# Patient Record
Sex: Female | Born: 1980
Health system: Southern US, Community
[De-identification: ages and names within clinical notes are randomized; demographics above are authoritative.]

---

## 2016-03-18 LAB — LIPID PANEL
CHOLESTEROL: 211 — AB (ref 0–200)
HDL: 56 (ref 35–70)
LDL CALC: 143

## 2016-03-18 LAB — HEPATIC FUNCTION PANEL
ALK PHOS: 126 — AB (ref 25–125)
ALT: 14 (ref 7–35)
AST: 15 (ref 13–35)
Bilirubin, Total: 0.3

## 2016-03-18 LAB — CBC AND DIFFERENTIAL
HEMATOCRIT: 42 (ref 36–46)
Hemoglobin: 13.8 (ref 12.0–16.0)
Platelets: 331 (ref 150–399)

## 2016-03-18 LAB — VITAMIN D 25 HYDROXY (VIT D DEFICIENCY, FRACTURES): Vit D, 25-Hydroxy: 58

## 2016-03-18 LAB — TSH: TSH: 1.65 (ref 0.41–5.90)

## 2016-03-18 LAB — VITAMIN B12: Vitamin B-12: 1049

## 2016-03-18 LAB — HM PAP SMEAR: HM Pap smear: NEGATIVE

## 2016-03-18 LAB — BASIC METABOLIC PANEL: CREATININE: 0.9 (ref 0.5–1.1)

## 2018-01-01 ENCOUNTER — Other Ambulatory Visit: Payer: Self-pay

## 2018-01-01 ENCOUNTER — Encounter: Payer: Self-pay | Admitting: *Deleted

## 2018-01-01 ENCOUNTER — Emergency Department
Admission: EM | Admit: 2018-01-01 | Discharge: 2018-01-01 | Disposition: A | Discharge status: Home / Self Care | Payer: Federal, State, Local not specified - PPO | Source: Home / Self Care | Attending: Family Medicine | Admitting: Family Medicine

## 2018-01-01 ENCOUNTER — Emergency Department (INDEPENDENT_AMBULATORY_CARE_PROVIDER_SITE_OTHER): Payer: Federal, State, Local not specified - PPO

## 2018-01-01 DIAGNOSIS — M940 Chondrocostal junction syndrome [Tietze]: Secondary | ICD-10-CM | POA: Diagnosis not present

## 2018-01-01 DIAGNOSIS — R0789 Other chest pain: Secondary | ICD-10-CM

## 2018-01-01 DIAGNOSIS — R079 Chest pain, unspecified: Secondary | ICD-10-CM | POA: Diagnosis not present

## 2018-01-01 NOTE — ED Triage Notes (Signed)
Pt c/o intermittent epigastric pain x 2 wks; she now c/o a fullness in the center of her chest. Denies fever or cough. 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

## 2018-01-01 NOTE — Discharge Instructions (Addendum)
Apply ice pack for 20 to 30 minutes, 3 to 4 times daily  Continue until pain decreases.  May take Ibuprofen 200mg, 4 tabs every 8 hours with food.  

## 2018-01-01 NOTE — ED Provider Notes (Signed)
[facility]    CSN: [id number] Arrival date & time: 01/01/18  1037     History   Chief Complaint Chief Complaint  Patient presents with  . Abdominal Pain    HPI Kirsten Cooper is a 37 y.o. female.   Patient reports that she had mild URI symptoms 2 weeks ago with sore throat and cough.  Several days later she developed soreness in her lower mid anterior chest, worse with inspiration and movement.  She denies shortness of breath or wheezing.  No nausea/vomiting. The pain awakens her at night and improves when she sits up.  No fevers, chills, and sweats.  Her respiratory symptoms have resolved.  The history is provided by the patient.  Abdominal Pain  Pain location:  Epigastric Pain quality: fullness   Pain radiates to:  Does not radiate Pain severity:  Mild Onset quality:  Gradual Duration:  2 weeks Timing:  Constant Progression:  Unchanged Chronicity:  New Context: awakening from sleep and recent illness   Context: not diet changes, not eating, not recent travel, not sick contacts and not trauma   Relieved by:  Nothing Worsened by:  Movement, deep breathing, palpation and position changes Ineffective treatments:  None tried Associated symptoms: chest pain, cough and sore throat   Associated symptoms: no anorexia, no belching, no chills, no diarrhea, no dysuria, no fatigue, no fever, no hematemesis, no hematochezia, no hematuria, no nausea, no shortness of breath and no vomiting     History reviewed. No pertinent past medical history.  There are no active problems to display for this patient.   Past Surgical History:  Procedure Laterality Date  . CESAREAN SECTION      OB History   None      Home Medications    Prior to Admission medications   Not on File    Family History History reviewed. No pertinent family history.  Social History Social History   Tobacco Use  . Smoking status: Never Smoker  . Smokeless tobacco: Never Used    Substance Use Topics  . Alcohol use: Never    Frequency: Never  . Drug use: Never     Allergies   Patient has no known allergies.   Review of Systems Review of Systems  Constitutional: Negative for chills, fatigue and fever.  HENT: Positive for sore throat.   Eyes: Negative.   Respiratory: Positive for cough and chest tightness. Negative for shortness of breath and wheezing.   Cardiovascular: Positive for chest pain. Negative for leg swelling.  Gastrointestinal: Positive for abdominal pain. Negative for anorexia, diarrhea, hematemesis, hematochezia, nausea and vomiting.  Genitourinary: Negative for dysuria and hematuria.  Musculoskeletal: Negative.   Skin: Negative.      Physical Exam Triage Vital Signs ED Triage Vitals  Enc Vitals Group     BP      Pulse      Resp      Temp      Temp src      SpO2      Weight      Height      Head Circumference      Peak Flow      Pain Score      Pain Loc      Pain Edu?      Excl. in GC?    No data found.  Updated Vital Signs BP 121/77 (BP Location: Right Arm)   Pulse 75   Temp 98.4 F (36.9 C) (Oral)  Resp 16   Ht 5' 2.5" (1.588 m)   Wt 70.8 kg   LMP 12/26/2017   SpO2 100%   BMI 28.08 kg/m   Visual Acuity Right Eye Distance:   Left Eye Distance:   Bilateral Distance:    Right Eye Near:   Left Eye Near:    Bilateral Near:     Physical Exam  Constitutional: She appears well-developed and well-nourished. She does not appear ill. No distress.  HENT:  Head: Normocephalic.  Right Ear: External ear normal.  Left Ear: External ear normal.  Nose: Nose normal.  Mouth/Throat: Oropharynx is clear and moist.  Eyes: Pupils are equal, round, and reactive to light. Conjunctivae are normal.  Neck: Neck supple.  Cardiovascular: Normal heart sounds.  Pulmonary/Chest: Breath sounds normal. She exhibits bony tenderness. She exhibits no crepitus, no edema and no swelling.  There is distinct tenderness to palpation over  the xiphoid process; palpation there recreates her pain.    Abdominal: Soft. Bowel sounds are normal. She exhibits no distension and no mass. There is no tenderness. There is no guarding.  Musculoskeletal: She exhibits no edema or tenderness.  Lymphadenopathy:    She has no cervical adenopathy.  Neurological: She is alert.  Skin: Skin is warm and dry. No rash noted.  Nursing note and vitals reviewed.    UC Treatments / Results  Labs (all labs ordered are listed, but only abnormal results are displayed) Labs Reviewed - No data to display  EKG None  Radiology Dg Chest 2 View  Result Date: 01/01/2018 CLINICAL DATA:  Two weeks of anterior chest pain EXAM: CHEST - 2 VIEW COMPARISON:  None. FINDINGS: The heart size and mediastinal contours are within normal limits. Both lungs are clear. The visualized skeletal structures are unremarkable. IMPRESSION: No active cardiopulmonary disease. Electronically Signed   By: Alcide Clever M.D.   On: 01/01/2018 11:37    Procedures Procedures (including critical care time)  Medications Ordered in UC Medications - No data to display  Initial Impression / Assessment and Plan / UC Course  I have reviewed the triage vital signs and the nursing notes.  Pertinent labs & imaging results that were available during my care of the patient were reviewed by me and considered in my medical decision making (see chart for details).    Reassurance. Followup with Dr. Rodney Langton or Dr. Clementeen [facility] ([facility]) if not improving about two weeks.    Final Clinical Impressions(s) / UC Diagnoses   Final diagnoses:  Costochondritis     Discharge Instructions     Apply ice pack for 20 to 30 minutes, 3 to 4 times daily  Continue until pain decreases.  May take Ibuprofen 200mg , 4 tabs every 8 hours with food.     ED Prescriptions    None         Lattie Haw, MD 01/01/18 1157

## 2018-01-18 ENCOUNTER — Encounter: Payer: Self-pay | Admitting: Family Medicine

## 2018-01-18 ENCOUNTER — Emergency Department (INDEPENDENT_AMBULATORY_CARE_PROVIDER_SITE_OTHER)
Admission: EM | Admit: 2018-01-18 | Discharge: 2018-01-18 | Disposition: A | Discharge status: Home / Self Care | Payer: Federal, State, Local not specified - PPO | Source: Home / Self Care

## 2018-01-18 ENCOUNTER — Other Ambulatory Visit: Payer: Self-pay

## 2018-01-18 DIAGNOSIS — A071 Giardiasis [lambliasis]: Secondary | ICD-10-CM | POA: Diagnosis not present

## 2018-01-18 MED ORDER — METRONIDAZOLE 500 MG PO TABS: 500.0000 mg | ORAL_TABLET | Freq: Two times a day (BID) | ORAL | 0 refills | Status: DC

## 2018-01-18 MED ORDER — ALUM & MAG HYDROXIDE-SIMETH 200-200-20 MG/5ML PO SUSP: 30.0000 mL | Freq: Once | ORAL | Status: DC

## 2018-01-18 MED ORDER — LIDOCAINE VISCOUS HCL 2 % MT SOLN: 15.0000 mL | Freq: Once | OROMUCOSAL | Status: DC

## 2018-01-18 MED ORDER — ALUM & MAG HYDROXIDE-SIMETH 200-200-20 MG/5ML PO SUSP
30.0000 mL | Freq: Once | ORAL | Status: AC
  Administered 2018-01-18: 30 mL via ORAL

## 2018-01-18 MED ORDER — LIDOCAINE VISCOUS HCL 2 % MT SOLN
15.0000 mL | Freq: Once | OROMUCOSAL | Status: AC
  Administered 2018-01-18: 15 mL via OROMUCOSAL

## 2018-01-18 NOTE — ED Triage Notes (Signed)
Pt was seen here a couple of weeks ago, chest pain has resolved, but still having epigastric pain, 7 out of 10 now and 10 in the evenings.  No change in pain level with what she is eating.

## 2018-01-18 NOTE — Discharge Instructions (Addendum)
Your symptoms and exam are most consistent with Giardia.  There is a possibility that you have H. pylori, but I think this is less likely.  If you are not getting better over the next 3 to 4 days, please return for further evaluation.

## 2018-01-18 NOTE — ED Provider Notes (Signed)
[facility]    CSN: [id number] Arrival date & time: 01/18/18  1433     History   Chief Complaint Chief Complaint  Patient presents with  . Abdominal Pain    HPI Kirsten Cooper is a 37 y.o. female.   This 37 year old woman presents with abdominal pain.  She was recently seen (01/01/2018) for costochondritis with pain in her epigastrium/xiphoid area.  She was prescribed ibuprofen 800 mg 3 times daily.  At the time she said that she had 2 different kinds of pain, one the rib pain and the other was a deeper pain.  She went to the doctor to focus on her rib pain at the time.  Patient can recall the first day this started.  Its been a month now.  Patient's been having bloating, belching, substernal and epigastric pressure and burning.  She says the belching is foul tasting.  She is also had a change in her bowel pattern so that she is no longer regular.  Patient denies vomiting, fever, or diarrhea.  There is been no blood in the stool.  Nobody else is sick at home.  There are no accompanying urinary symptoms.  Patient has taken Tums, magnesium citrate without any relief.  She states that she has recently moved from [place].     History reviewed. No pertinent past medical history.  There are no active problems to display for this patient.   Past Surgical History:  Procedure Laterality Date  . CESAREAN SECTION      OB History   None      Home Medications    Prior to Admission medications   Medication Sig Start Date End Date Taking? Authorizing Provider  metroNIDAZOLE (FLAGYL) 500 MG tablet Take 1 tablet (500 mg total) by mouth 2 (two) times daily. 01/18/18   Elvina Sidle, MD    Family History History reviewed. No pertinent family history.  Social History Social History   Tobacco Use  . Smoking status: Never Smoker  . Smokeless tobacco: Never Used  Substance Use Topics  . Alcohol use: Never    Frequency: Never  . Drug use: Never      Allergies   Patient has no known allergies.   Review of Systems Review of Systems  Gastrointestinal: Positive for abdominal distention, abdominal pain and constipation.  All other systems reviewed and are negative.    Physical Exam Triage Vital Signs ED Triage Vitals  Enc Vitals Group     BP      Pulse      Resp      Temp      Temp src      SpO2      Weight      Height      Head Circumference      Peak Flow      Pain Score      Pain Loc      Pain Edu?      Excl. in GC?    No data found.  Updated Vital Signs BP 120/83 (BP Location: Right Arm)   Pulse 74   Temp 98 F (36.7 C) (Oral)   Ht 5' 2.5" (1.588 m)   Wt 72.1 kg   LMP 12/26/2017   SpO2 100%   BMI 28.62 kg/m    Physical Exam  Constitutional: She is oriented to person, place, and time. She appears well-developed and well-nourished.  HENT:  Head: Normocephalic and atraumatic.  Mouth/Throat: Oropharynx is clear and moist.  Eyes:  EOM are normal.  Cardiovascular: Normal rate.  Pulmonary/Chest: Effort normal and breath sounds normal.  Abdominal: Soft. Normal appearance and bowel sounds are normal. There is no splenomegaly or hepatomegaly. There is tenderness in the epigastric area. There is no rigidity.  Neurological: She is alert and oriented to person, place, and time.  Skin: Skin is warm.  Psychiatric: She has a normal mood and affect. Her behavior is normal.  Nursing note and vitals reviewed.  Relief after the GI cocktail.  UC Treatments / Results  Labs (all labs ordered are listed, but only abnormal results are displayed) Labs Reviewed - No data to display  EKG None  Radiology No results found.  Procedures Procedures (including critical care time)  Medications Ordered in UC Medications  alum & mag hydroxide-simeth (MAALOX/MYLANTA) 200-200-20 MG/5ML suspension 30 mL (30 mLs Oral Given 01/18/18 1507)  lidocaine (XYLOCAINE) 2 % viscous mouth solution 15 mL (15 mLs Mouth/Throat Given  01/18/18 1511)    Initial Impression / Assessment and Plan / UC Course  I have reviewed the triage vital signs and the nursing notes.  Pertinent labs & imaging results that were available during my care of the patient were reviewed by me and considered in my medical decision making (see chart for details).     Final Clinical Impressions(s) / UC Diagnoses   Final diagnoses:  Giardia     Discharge Instructions     Your symptoms and exam are most consistent with Giardia.  There is a possibility that you have H. pylori, but I think this is less likely.  If you are not getting better over the next 3 to 4 days, please return for further evaluation.    ED Prescriptions    Medication Sig Dispense Auth. Provider   metroNIDAZOLE (FLAGYL) 500 MG tablet Take 1 tablet (500 mg total) by mouth 2 (two) times daily. 14 tablet Elvina Sidle, MD     Controlled Substance Prescriptions [place] Controlled Substance Registry consulted? Not Applicable   Elvina Sidle, MD 01/18/18 [id number]

## 2018-01-25 ENCOUNTER — Encounter: Payer: Self-pay | Admitting: Osteopathic Medicine

## 2018-01-25 ENCOUNTER — Ambulatory Visit: Payer: Federal, State, Local not specified - PPO | Admitting: Osteopathic Medicine

## 2018-01-25 ENCOUNTER — Ambulatory Visit (INDEPENDENT_AMBULATORY_CARE_PROVIDER_SITE_OTHER): Payer: Federal, State, Local not specified - PPO | Admitting: Osteopathic Medicine

## 2018-01-25 VITALS — BP 120/87 | HR 70 | Temp 98.2°F | Ht 62.5 in | Wt 160.9 lb

## 2018-01-25 DIAGNOSIS — R4189 Other symptoms and signs involving cognitive functions and awareness: Secondary | ICD-10-CM

## 2018-01-25 DIAGNOSIS — R413 Other amnesia: Secondary | ICD-10-CM

## 2018-01-25 NOTE — Patient Instructions (Signed)
Given memory issues/cognitive impairment, will go ahead and get lab work fasting at your earliest convenience, and if everything is normal we may want to consider formalized testing with a memory specialist.

## 2018-01-25 NOTE — Progress Notes (Signed)
HPI: Kirsten Cooper is a 37 y.o. female who  has no past medical history on file.  she presents to [facility] today, 01/25/18,  for chief complaint of: New to establish Memory concerns   Recently seen in urgent care, twice over the past couple of months.  Once for costochondritis, once for Giardia.  She states she is feeling a lot better now.  Main concern today is memory issues/concerns with cognitive function.  She states that over the past couple of years, she has noticed issues with short-term memory, her family has noticed that she seems to be forgetting things like movies that she has seen recently, she will be driving somewhere and forget where she was going, she frequently feels foggy.  She saw a physician for this a few years ago who recommended stress reduction, the patient does not have any history of anxiety/depression.  She is not sure if she got any blood work done.  She remembers getting brain scans which were normal. No history of attention deficit disorder or learning disability.   G4 P2-1-1-2, no history of gestational diabetes or hypertension/preeclampsia.  Status post C-section x2.  Monogamous with husband, declines STD testing.  Ports last Pap 2018, last mammogram 2018, reports last tetanus shot 3 years ago, no family history of colon cancer or breast cancer    Past medical, surgical, social and family history reviewed:  Patient Active Problem List   Diagnosis Date Noted  . Memory loss or impairment 01/25/2018    Past Surgical History:  Procedure Laterality Date  . CESAREAN SECTION      Social History   Tobacco Use  . Smoking status: Never Smoker  . Smokeless tobacco: Never Used  Substance Use Topics  . Alcohol use: Never    Frequency: Never    History reviewed. No pertinent family history.   Current medication list and allergy/intolerance information reviewed:    No current outpatient medications on file.   No  current facility-administered medications for this visit.     No Known Allergies    Review of Systems:  Constitutional:  No  fever, no chills, No recent illness, No unintentional weight changes. No significant fatigue.   HEENT: No  headache, no vision change, no hearing change, No sore throat, No  sinus pressure  Cardiac: No  chest pain, No  pressure, No palpitations, No  Orthopnea  Respiratory:  No  shortness of breath. No  Cough  Gastrointestinal: No  abdominal pain, No  nausea, No  vomiting,  No  blood in stool, No  diarrhea, No  constipation   Musculoskeletal: No new myalgia/arthralgia   Skin: No  Rash, No other wounds/concerning lesions  Genitourinary: No  incontinence, No  abnormal genital bleeding, No abnormal genital discharge  Hem/Onc: No  easy bruising/bleeding, No  abnormal lymph node  Endocrine: No cold intolerance,  No heat intolerance. No polyuria/polydipsia/polyphagia   Neurologic: No  weakness, No  dizziness, No  slurred speech/focal weakness/facial droop  Psychiatric: No  concerns with depression, No  concerns with anxiety, No sleep problems, No mood problems  Exam:  BP 120/87 (BP Location: Left Arm, Patient Position: Sitting, Cuff Size: Normal)   Pulse 70   Temp 98.2 F (36.8 C) (Oral)   Ht 5' 2.5" (1.588 m)   Wt 160 lb 14.4 oz (73 kg)   LMP 12/26/2017   BMI 28.96 kg/m   Constitutional: VS see above. General Appearance: alert, well-developed, well-nourished, NAD  Eyes: Normal lids and  conjunctive, non-icteric sclera  Ears, Nose, Mouth, Throat: MMM, Normal external inspection ears/nares/mouth/lips/gums. TM normal bilaterally. Pharynx/tonsils no erythema, no exudate. Nasal mucosa normal.   Neck: No masses, trachea midline. No thyroid enlargement. No tenderness/mass appreciated. No lymphadenopathy  Respiratory: Normal respiratory effort. no wheeze, no rhonchi, no rales  Cardiovascular: S1/S2 normal, no murmur, no rub/gallop auscultated. RRR. No  lower extremity edema. Pedal pulse II/IV bilaterally DP and PT.   Gastrointestinal: Nontender, no masses. No hepatomegaly, no splenomegaly. No hernia appreciated. Bowel sounds normal. Rectal exam deferred.   Musculoskeletal: Gait normal. No clubbing/cyanosis of digits.   Neurological: Normal balance/coordination. No tremor. No cranial nerve deficit on limited exam. Motor and sensation intact and symmetric. Cerebellar reflexes intact.   Skin: warm, dry, intact. No rash/ulcer. No concerning nevi or subq nodules on limited exam.    Psychiatric: Normal judgment/insight. Normal mood and affect. Oriented x3.     ASSESSMENT/PLAN: The primary encounter diagnosis was Memory loss or impairment. A diagnosis of Cognitive impairment was also pertinent to this visit.  Orders Placed This Encounter  Procedures  . CBC  . COMPLETE METABOLIC PANEL WITH GFR  . Lipid panel  . TSH  . VITAMIN D 25 Hydroxy (Vit-D Deficiency, Fractures)  . Sedimentation rate  . Vitamin B12  . Folate RBC         Patient Instructions  Given memory issues/cognitive impairment, will go ahead and get lab work fasting at your earliest convenience, and if everything is normal we may want to consider formalized testing with a memory specialist.      Visit summary with medication list and pertinent instructions was printed for patient to review. All questions at time of visit were answered - patient instructed to contact office with any additional concerns. ER/RTC precautions were reviewed with the patient.   Follow-up plan: Return for annual physical sometime in the next year, see me sooner if needed.     Please note: voice recognition software was used to produce this document, and typos may escape review. Please contact Dr. Lyn HollingsheadAlexander for any needed clarifications.

## 2018-02-06 LAB — COMPLETE METABOLIC PANEL WITH GFR
AG RATIO: 1.4 (calc) (ref 1.0–2.5)
ALKALINE PHOSPHATASE (APISO): 111 U/L (ref 33–115)
ALT: 13 U/L (ref 6–29)
AST: 16 U/L (ref 10–30)
Albumin: 3.9 g/dL (ref 3.6–5.1)
BILIRUBIN TOTAL: 0.4 mg/dL (ref 0.2–1.2)
BUN: 10 mg/dL (ref 7–25)
CHLORIDE: 103 mmol/L (ref 98–110)
CO2: 26 mmol/L (ref 20–32)
Calcium: 9.1 mg/dL (ref 8.6–10.2)
Creat: 0.83 mg/dL (ref 0.50–1.10)
GFR, EST NON AFRICAN AMERICAN: 89 mL/min/{1.73_m2} (ref >=60)
GFR, Est African American: 103 mL/min/{1.73_m2} (ref >=60)
GLOBULIN: 2.8 g/dL (ref 1.9–3.7)
Glucose, Bld: 87 mg/dL (ref 65–139)
POTASSIUM: 3.8 mmol/L (ref 3.5–5.3)
SODIUM: 139 mmol/L (ref 135–146)
Total Protein: 6.7 g/dL (ref 6.1–8.1)

## 2018-02-06 LAB — FOLATE RBC: RBC FOLATE: 759 ng/mL (ref >280)

## 2018-02-06 LAB — VITAMIN B12: VITAMIN B 12: 1002 pg/mL (ref 200–1100)

## 2018-02-06 LAB — SEDIMENTATION RATE: Sed Rate: 9 mm/h (ref 0–20)

## 2018-02-06 LAB — CBC
HEMATOCRIT: 39.7 % (ref 35.0–45.0)
Hemoglobin: 13.3 g/dL (ref 11.7–15.5)
MCH: 29.1 pg (ref 27.0–33.0)
MCHC: 33.5 g/dL (ref 32.0–36.0)
MCV: 86.9 fL (ref 80.0–100.0)
MPV: 11 fL (ref 7.5–12.5)
PLATELETS: 326 10*3/uL (ref 140–400)
RBC: 4.57 10*6/uL (ref 3.80–5.10)
RDW: 12.5 % (ref 11.0–15.0)
WBC: 8.6 10*3/uL (ref 3.8–10.8)

## 2018-02-06 LAB — LIPID PANEL
Cholesterol: 202 mg/dL — ABNORMAL HIGH (ref <200)
HDL: 56 mg/dL (ref >50)
LDL Cholesterol (Calc): 129 mg/dL (calc) — ABNORMAL HIGH
NON-HDL CHOLESTEROL (CALC): 146 mg/dL — AB (ref <130)
Total CHOL/HDL Ratio: 3.6 (calc) (ref <5.0)
Triglycerides: 76 mg/dL (ref <150)

## 2018-02-06 LAB — VITAMIN D 25 HYDROXY (VIT D DEFICIENCY, FRACTURES): Vit D, 25-Hydroxy: 27 ng/mL — ABNORMAL LOW (ref 30–100)

## 2018-02-06 LAB — TSH: TSH: 1.53 mIU/L

## 2018-03-21 ENCOUNTER — Encounter: Payer: Self-pay | Admitting: Osteopathic Medicine

## 2018-04-18 ENCOUNTER — Ambulatory Visit (INDEPENDENT_AMBULATORY_CARE_PROVIDER_SITE_OTHER): Payer: Federal, State, Local not specified - PPO | Admitting: Osteopathic Medicine

## 2018-04-18 ENCOUNTER — Encounter: Payer: Self-pay | Admitting: Osteopathic Medicine

## 2018-04-18 VITALS — BP 110/67 | HR 70 | Temp 98.1°F | Wt 162.1 lb

## 2018-04-18 DIAGNOSIS — Z0000 Encounter for general adult medical examination without abnormal findings: Secondary | ICD-10-CM | POA: Diagnosis not present

## 2018-04-18 DIAGNOSIS — G4484 Primary exertional headache: Secondary | ICD-10-CM

## 2018-04-18 HISTORY — DX: Primary exertional headache: G44.84

## 2018-04-18 NOTE — Progress Notes (Signed)
HPI: Kirsten Cooper is a 38 y.o. female who  has no past medical history on file.  she presents to [facility] today, 04/18/18,  for chief complaint of: Annual Check Up    Patient here for annual physical / wellness exam.  See preventive care reviewed as below.  Recent labs 01/2018 reviewed in detail with the patient.   Additional concerns today include: Headaches during and after working out - she is going to the gym more and trying to get healthy as she approaches 38 yo. Headache feels behind eyes "like something is going ot pop" and resolves with rest. She's been trying to "push through" her workouts anyway.      Past medical, surgical, social and family history reviewed:  Patient Active Problem List   Diagnosis Date Noted  . Memory loss or impairment 01/25/2018    Past Surgical History:  Procedure Laterality Date  . CESAREAN SECTION      Social History   Tobacco Use  . Smoking status: Never Smoker  . Smokeless tobacco: Never Used  Substance Use Topics  . Alcohol use: Never    Frequency: Never    No family history on file.   Current medication list and allergy/intolerance information reviewed:    No current outpatient medications on file.   No current facility-administered medications for this visit.     No Known Allergies    Review of Systems:  Constitutional:  No  fever, no chills, No recent illness, No unintentional weight changes. No significant fatigue.   HEENT: +headache, no vision change, no hearing change, No sore throat, No  sinus pressure  Cardiac: No  chest pain, No  pressure, No palpitations, No  Orthopnea  Respiratory:  No  shortness of breath. No  Cough  Gastrointestinal: No  abdominal pain, No  nausea, No  vomiting,  No  blood in stool, No  diarrhea, No  constipation   Musculoskeletal: No new myalgia/arthralgia  Skin: No  Rash, No other wounds/concerning lesions  Genitourinary: No  incontinence,  No  abnormal genital bleeding, No abnormal genital discharge  Hem/Onc: No  easy bruising/bleeding, No  abnormal lymph node  Endocrine: No cold intolerance,  No heat intolerance. No polyuria/polydipsia/polyphagia   Neurologic: No  weakness, No  dizziness, No  slurred speech/focal weakness/facial droop  Psychiatric: No  concerns with depression, No  concerns with anxiety, No sleep problems, No mood problems  Exam:  BP 110/67 (BP Location: Left Arm, Patient Position: Sitting, Cuff Size: Normal)   Pulse 70   Temp 98.1 F (36.7 C) (Oral)   Wt 162 lb 1.6 oz (73.5 kg)   BMI 29.18 kg/m   Constitutional: VS see above. General Appearance: alert, well-developed, well-nourished, NAD  Eyes: Normal lids and conjunctive, non-icteric sclera, EOMI, limited nondilated funduscopic exam but I see no papilledema  Ears, Nose, Mouth, Throat: MMM, Normal external inspection ears/nares/mouth/lips/gums. TM normal bilaterally. Pharynx/tonsils no erythema, no exudate. Nasal mucosa normal.   Neck: No masses, trachea midline. No thyroid enlargement. No tenderness/mass appreciated. No lymphadenopathy  Respiratory: Normal respiratory effort. no wheeze, no rhonchi, no rales  Cardiovascular: S1/S2 normal, no murmur, no rub/gallop auscultated. RRR. No lower extremity edema.   Gastrointestinal: Nontender, no masses. No hepatomegaly, no splenomegaly. No hernia appreciated. Bowel sounds normal. Rectal exam deferred.   Musculoskeletal: Gait normal. No clubbing/cyanosis of digits.   Neurological: Normal balance/coordination. No tremor. No cranial nerve deficit on limited exam. Motor and sensation intact and symmetric. Cerebellar reflexes intact.  Skin: warm, dry, intact. No rash/ulcer. No concerning nevi or subq nodules on limited exam.    Psychiatric: Normal judgment/insight. Normal mood and affect. Oriented x3.        ASSESSMENT/PLAN: The primary encounter diagnosis was Annual physical exam. A diagnosis of  Exertional headache was also pertinent to this visit.   Orders Placed This Encounter  Procedures  . MR Angiogram Head Wo Contrast  . MR Brain Wo Contrast      Patient Instructions  General Preventive Care  Most recent routine screening lipids/other labs: already checked 01/2018  Everyone should have blood pressure checked once per year.   Tobacco: don't!   Exercise: as tolerated to reduce risk of cardiovascular disease and diabetes. Strength training will also prevent osteoporosis.   Mental health: if need for mental health care (medicines, counseling, other), or concerns about moods, please let me know!   Sexual health: if need for STD testing, or if concerns with libido/pain problems, please let me know! If you need to discuss your birth control options, please let me know!   Advanced Directive: Living Will and/or Healthcare Power of Attorney recommended for all adults, regardless of age or health.  Vaccines  Flu vaccine: recommended for almost everyone, every fall.   Shingles vaccine: Shingrix recommended after age 96.   Pneumonia vaccines: Prevnar and Pneumovax recommended after age 39, or sooner if certain medical conditions.  Tetanus booster: Tdap recommended every 10 years.  Cancer screenings   Colon cancer screening: recommended for everyone at age 35  Breast cancer screening: mammogram recommended at age 66 every other year at least, and annually after age 62.   Cervical cancer screening: Pap every 1 to 5 years depending on age and other risk factors. Can usually stop at age 80 or w/ hysterectomy.   Lung cancer screening: not needed for non-smokers Infection screenings . HIV: recommended screening at least once age 26-65, more often as needed. . Gonorrhea/Chlamydia: screening as needed. . Hepatitis C: recommended once for anyone born [birth year]-1965 . TB: certain at-risk populations, or depending on work requirements and/or travel history Other . Bone Density  Test: recommended for women at age 70    Please note: Preventive care issues were addressed today as part of your annual wellness physical, and this care should be covered under your insurance. However there were other medical issues which were also addressed today, and insurance may bill you separately for a "problem-based visit" for this care: headaches. Any questions or concerns about charges which may appear on your billing statement should be directed to your insurance company or to [facility] billing department, and they will contact our office if there are further concerns.          Visit summary with medication list and pertinent instructions was printed for patient to review. All questions at time of visit were answered - patient instructed to contact office with any additional concerns or updates. ER/RTC precautions were reviewed with the patient.     Please note: voice recognition software was used to produce this document, and typos may escape review. Please contact Dr. Lyn Hollingshead for any needed clarifications.     Follow-up plan: Return for recheck depending on MRI results - if any abnormality, will plan to see you in office to discuss .

## 2018-04-18 NOTE — Patient Instructions (Addendum)
General Preventive Care  Most recent routine screening lipids/other labs: already checked 01/2018  Everyone should have blood pressure checked once per year.   Tobacco: don't!   Exercise: as tolerated to reduce risk of cardiovascular disease and diabetes. Strength training will also prevent osteoporosis.   Mental health: if need for mental health care (medicines, counseling, other), or concerns about moods, please let me know!   Sexual health: if need for STD testing, or if concerns with libido/pain problems, please let me know! If you need to discuss your birth control options, please let me know!   Advanced Directive: Living Will and/or Healthcare Power of Attorney recommended for all adults, regardless of age or health.  Vaccines  Flu vaccine: recommended for almost everyone, every fall.   Shingles vaccine: Shingrix recommended after age 26.   Pneumonia vaccines: Prevnar and Pneumovax recommended after age 62, or sooner if certain medical conditions.  Tetanus booster: Tdap recommended every 10 years.  Cancer screenings   Colon cancer screening: recommended for everyone at age 67  Breast cancer screening: mammogram recommended at age 3 every other year at least, and annually after age 40.   Cervical cancer screening: Pap every 1 to 5 years depending on age and other risk factors. Can usually stop at age 65 or w/ hysterectomy.   Lung cancer screening: not needed for non-smokers Infection screenings . HIV: recommended screening at least once age 35-65, more often as needed. . Gonorrhea/Chlamydia: screening as needed. . Hepatitis C: recommended once for anyone born [birth year]-1965 . TB: certain at-risk populations, or depending on work requirements and/or travel history Other . Bone Density Test: recommended for women at age 59    Please note: Preventive care issues were addressed today as part of your annual wellness physical, and this care should be covered under your  insurance. However there were other medical issues which were also addressed today, and insurance may bill you separately for a "problem-based visit" for this care: headaches. Any questions or concerns about charges which may appear on your billing statement should be directed to your insurance company or to [facility] billing department, and they will contact our office if there are further concerns.

## 2018-04-30 ENCOUNTER — Ambulatory Visit (INDEPENDENT_AMBULATORY_CARE_PROVIDER_SITE_OTHER): Payer: Federal, State, Local not specified - PPO

## 2018-04-30 DIAGNOSIS — G4484 Primary exertional headache: Secondary | ICD-10-CM

## 2018-04-30 DIAGNOSIS — R51 Headache: Secondary | ICD-10-CM | POA: Diagnosis not present

## 2018-09-09 ENCOUNTER — Emergency Department (HOSPITAL_BASED_OUTPATIENT_CLINIC_OR_DEPARTMENT_OTHER): Payer: Federal, State, Local not specified - PPO

## 2018-09-09 ENCOUNTER — Encounter (HOSPITAL_BASED_OUTPATIENT_CLINIC_OR_DEPARTMENT_OTHER): Payer: Self-pay | Admitting: *Deleted

## 2018-09-09 ENCOUNTER — Other Ambulatory Visit: Payer: Self-pay

## 2018-09-09 ENCOUNTER — Emergency Department (HOSPITAL_BASED_OUTPATIENT_CLINIC_OR_DEPARTMENT_OTHER)
Admission: EM | Admit: 2018-09-09 | Discharge: 2018-09-09 | Disposition: A | Discharge status: Home / Self Care | Payer: Federal, State, Local not specified - PPO | Attending: Emergency Medicine | Admitting: Emergency Medicine

## 2018-09-09 DIAGNOSIS — R0789 Other chest pain: Secondary | ICD-10-CM

## 2018-09-09 DIAGNOSIS — K2100 Gastro-esophageal reflux disease with esophagitis, without bleeding: Secondary | ICD-10-CM

## 2018-09-09 DIAGNOSIS — R0602 Shortness of breath: Secondary | ICD-10-CM | POA: Diagnosis not present

## 2018-09-09 HISTORY — DX: Gastro-esophageal reflux disease with esophagitis, without bleeding: K21.00

## 2018-09-09 LAB — LIPASE, BLOOD: Lipase: 71 U/L — ABNORMAL HIGH (ref 11–51)

## 2018-09-09 LAB — D-DIMER, QUANTITATIVE: D-Dimer, Quant: 0.43 ug/mL-FEU (ref 0.00–0.50)

## 2018-09-09 LAB — COMPREHENSIVE METABOLIC PANEL
ALT: 11 U/L (ref 0–44)
AST: 23 U/L (ref 15–41)
Albumin: 3.7 g/dL (ref 3.5–5.0)
Alkaline Phosphatase: 124 U/L (ref 38–126)
Anion gap: 8 (ref 5–15)
BUN: 9 mg/dL (ref 6–20)
CO2: 21 mmol/L — ABNORMAL LOW (ref 22–32)
Calcium: 8.4 mg/dL — ABNORMAL LOW (ref 8.9–10.3)
Chloride: 107 mmol/L (ref 98–111)
Creatinine, Ser: 0.76 mg/dL (ref 0.44–1.00)
GFR calc Af Amer: >60 mL/min (ref >60)
GFR calc non Af Amer: >60 mL/min (ref >60)
Glucose, Bld: 93 mg/dL (ref 70–99)
Potassium: 3.9 mmol/L (ref 3.5–5.1)
Sodium: 136 mmol/L (ref 135–145)
Total Bilirubin: 0.6 mg/dL (ref 0.3–1.2)
Total Protein: 6.9 g/dL (ref 6.5–8.1)

## 2018-09-09 LAB — CBC WITH DIFFERENTIAL/PLATELET
Abs Immature Granulocytes: 0.07 10*3/uL (ref 0.00–0.07)
Basophils Absolute: 0.1 10*3/uL (ref 0.0–0.1)
Basophils Relative: 1 %
Eosinophils Absolute: 0.2 10*3/uL (ref 0.0–0.5)
Eosinophils Relative: 2 %
HCT: 43.3 % (ref 36.0–46.0)
Hemoglobin: 13.8 g/dL (ref 12.0–15.0)
Immature Granulocytes: 1 %
Lymphocytes Relative: 26 %
Lymphs Abs: 3 10*3/uL (ref 0.7–4.0)
MCH: 28.9 pg (ref 26.0–34.0)
MCHC: 31.9 g/dL (ref 30.0–36.0)
MCV: 90.8 fL (ref 80.0–100.0)
Monocytes Absolute: 1.1 10*3/uL — ABNORMAL HIGH (ref 0.1–1.0)
Monocytes Relative: 10 %
Neutro Abs: 7.1 10*3/uL (ref 1.7–7.7)
Neutrophils Relative %: 60 %
Platelets: 270 10*3/uL (ref 150–400)
RBC: 4.77 MIL/uL (ref 3.87–5.11)
RDW: 12.6 % (ref 11.5–15.5)
WBC: 11.5 10*3/uL — ABNORMAL HIGH (ref 4.0–10.5)
nRBC: 0 % (ref 0.0–0.2)

## 2018-09-09 LAB — TROPONIN I (HIGH SENSITIVITY): Troponin I (High Sensitivity): <2 ng/L (ref <18)

## 2018-09-09 MED ORDER — ALUM & MAG HYDROXIDE-SIMETH 200-200-20 MG/5ML PO SUSP
30.0000 mL | Freq: Once | ORAL | Status: AC
  Administered 2018-09-09: 30 mL via ORAL
  Filled 2018-09-09: qty 30

## 2018-09-09 MED ORDER — LIDOCAINE VISCOUS HCL 2 % MT SOLN
15.0000 mL | Freq: Once | OROMUCOSAL | Status: AC
  Administered 2018-09-09: 22:00:00 15 mL via ORAL
  Filled 2018-09-09: qty 15

## 2018-09-09 NOTE — ED Notes (Signed)
EKG will be attempted once patient comes back from Xray

## 2018-09-09 NOTE — ED Notes (Signed)
Patient transported to X-ray 

## 2018-09-09 NOTE — Discharge Instructions (Signed)
All your testing today for blood clot and heart problems were normal.  Suspect this is related to your stomach.  Get some over-the-counter Prilosec to take for a few weeks and try to avoid ibuprofen products, spicy or acidic foods.  Also laying down flat may seem to make your symptoms worse.  You can also try Tums or Maalox as needed for the discomfort.  Sometimes milk will also help.

## 2018-09-09 NOTE — ED Provider Notes (Signed)
[facility] Provider Note   CSN: [id number] Arrival date & time: 09/09/18  2032     History   Chief Complaint Chief Complaint  Patient presents with  . Shortness of Breath    HPI Kirsten Cooper is a 38 y.o. female.     Patient is a 38 year old female with no significant past medical history who is presenting today with sudden onset of chest tightness and pressure and shortness of breath that started approximately 3 hours ago.  Patient states they got back from [facility] yesterday after an 8-hour car drive.  She had a mild headache today and took some ibuprofen which did improve her headache but caused her to have some stomach discomfort.  That had resolved spontaneously but tonight she developed a tight pressure sensation in her lower sternum that makes her have the sensation of not being able to get a deep breath.  She denies any cough, fever, sputum production.  She has no abdominal pain, nausea or vomiting.  She did try to drink some water and eat something and states it felt unusual when she swallowed.  Laying back may make things slightly worse but she is not sure of that.  She denies any unilateral leg pain or swelling.  She does not use drugs or tobacco  The history is provided by the patient.  Shortness of Breath Severity:  Moderate Onset quality:  Sudden Duration:  3 hours Timing:  Constant Progression:  Unchanged Chronicity:  New Context comment:  Started about 3 hours ago at rest Relieved by:  Nothing Worsened by:  Nothing Ineffective treatments: tried to drink water and eat something but didn't help. Associated symptoms: chest pain   Associated symptoms: no cough, no fever, no sputum production, no vomiting and no wheezing   Risk factors: prolonged immobilization   Risk factors: no family hx of DVT, no hx of PE/DVT, no obesity, no oral contraceptive use, no recent surgery and no tobacco use   Risk factors comment:  Recently  went ot [place] and 8 hours in the car there and back   History reviewed. No pertinent past medical history.  Patient Active Problem List   Diagnosis Date Noted  . Exertional headache 04/18/2018  . Memory loss or impairment 01/25/2018    Past Surgical History:  Procedure Laterality Date  . CESAREAN SECTION       OB History   No obstetric history on file.      Home Medications    Prior to Admission medications   Not on File    Family History No family history on file.  Social History Social History   Tobacco Use  . Smoking status: Never Smoker  . Smokeless tobacco: Never Used  Substance Use Topics  . Alcohol use: Never    Frequency: Never  . Drug use: Never     Allergies   Patient has no known allergies.   Review of Systems Review of Systems  Constitutional: Negative for fever.  Respiratory: Positive for shortness of breath. Negative for cough, sputum production and wheezing.   Cardiovascular: Positive for chest pain.  Gastrointestinal: Negative for vomiting.  All other systems reviewed and are negative.    Physical Exam Updated Vital Signs BP 128/83 (BP Location: Right Arm)   Pulse 73   Temp 98.2 F (36.8 C) (Oral)   Resp 16   Ht 5\' 2"  (1.575 m)   Wt 77.1 kg   LMP 08/19/2018 (Approximate)   SpO2 100%  BMI 31.09 kg/m   Physical Exam Vitals signs and nursing note reviewed.  Constitutional:      General: She is not in acute distress.    Appearance: She is well-developed.  HENT:     Head: Normocephalic and atraumatic.  Eyes:     Pupils: Pupils are equal, round, and reactive to light.  Cardiovascular:     Rate and Rhythm: Normal rate and regular rhythm.     Heart sounds: Normal heart sounds. No murmur. No friction rub.  Pulmonary:     Effort: Pulmonary effort is normal. No tachypnea or accessory muscle usage.     Breath sounds: Normal breath sounds. No wheezing or rales.     Comments: Intermittently taking deep breaths Abdominal:      General: Bowel sounds are normal. There is no distension.     Palpations: Abdomen is soft.     Tenderness: There is no abdominal tenderness. There is no guarding or rebound.  Musculoskeletal: Normal range of motion.        General: No tenderness.     Right lower leg: No edema.     Left lower leg: No edema.     Comments: No edema  Skin:    General: Skin is warm and dry.     Findings: No rash.  Neurological:     Mental Status: She is alert and oriented to person, place, and time.     Cranial Nerves: No cranial nerve deficit.  Psychiatric:        Mood and Affect: Mood normal.        Behavior: Behavior normal.        Thought Content: Thought content normal.      ED Treatments / Results  Labs (all labs ordered are listed, but only abnormal results are displayed) Labs Reviewed  CBC WITH DIFFERENTIAL/PLATELET - Abnormal; Notable for the following components:      Result Value   WBC 11.5 (*)    Monocytes Absolute 1.1 (*)    All other components within normal limits  COMPREHENSIVE METABOLIC PANEL - Abnormal; Notable for the following components:   CO2 21 (*)    Calcium 8.4 (*)    All other components within normal limits  LIPASE, BLOOD - Abnormal; Notable for the following components:   Lipase 71 (*)    All other components within normal limits  D-DIMER, QUANTITATIVE (NOT AT [facility])  TROPONIN I (HIGH SENSITIVITY)  TROPONIN I (HIGH SENSITIVITY)    EKG EKG Interpretation  Date/Time:  Sunday September 09 2018 20:46:47 EDT Ventricular Rate:  77 PR Interval:    QRS Duration: 89 QT Interval:  375 QTC Calculation: 425 R Axis:   32 Text Interpretation:  Sinus rhythm Low voltage, precordial leads Abnormal R-wave progression, early transition Baseline wander in lead(s) V3 No previous tracing Confirmed by Gwyneth SproutPlunkett, Shaketha Jeon ([id number]) on 09/09/2018 8:58:10 PM   Radiology Dg Chest 2 View  Result Date: 09/09/2018 CLINICAL DATA:  Shortness of breath EXAM: CHEST - 2 VIEW COMPARISON:   01/01/2018 FINDINGS: The heart size and mediastinal contours are within normal limits. Both lungs are clear. The visualized skeletal structures are unremarkable. IMPRESSION: No active cardiopulmonary disease. Electronically Signed   By: Katherine Mantlehristopher  Green M.D.   On: 09/09/2018 20:56    Procedures Procedures (including critical care time)  Medications Ordered in ED Medications  alum & mag hydroxide-simeth (MAALOX/MYLANTA) 200-200-20 MG/5ML suspension 30 mL (has no administration in time range)    And  lidocaine (XYLOCAINE) 2 % viscous  mouth solution 15 mL (has no administration in time range)     Initial Impression / Assessment and Plan / ED Course  I have reviewed the triage vital signs and the nursing notes.  Pertinent labs & imaging results that were available during my care of the patient were reviewed by me and considered in my medical decision making (see chart for details).        Pt with atypical story for CP that started approximately 3 hours prior to arrival but has not resolved.  In addition to the chest discomfort she also has shortness of breath but denies any nausea, vomiting or diaphoresis.  Heart score of  1 and no risk factors.  Low risk well's but due to recent prolonged immobilization will check d-dimer.  EKG with low voltage but no signs of ST abnormality .  CXR wnl.  Low suspicion for pneumothorax, pneumonia, COVID, dissection.  Lower suspicion for ACS, PE and suspect most likely GI origin as she had some stomach upset after taking ibuprofen.  CBC, CMP, lipase, d-dimer and trop pending.  Patient given GI cocktail   10:42 PM Patient symptoms initially completely resolved with a GI cocktail but when she started coughing and laid down the symptoms started to recur.  CMP without acute findings, CBC with leukocytosis of 11,000 and lipase elevated to 70.  D-dimer and troponin are both negative.  Given patient's pain has been ongoing for 3 hours and her troponin is negative do  not feel that she needs any repeat testing at this time.  Will treat with a PPI and Maalox.  Discussed with patient if symptoms worsen she may need evaluation of her gallbladder but today total bilirubin and LFTs are within normal limits and she does not have significant right upper quadrant pain or any significant abdominal pain on palpation.  Patient is comfortable with this plan and will return if symptoms worsen or will follow-up with her doctor if it is not improving.  Final Clinical Impressions(s) / ED Diagnoses   Final diagnoses:  Gastroesophageal reflux disease with esophagitis  Atypical chest pain    ED Discharge Orders    None       Gwyneth SproutPlunkett, Reya Aurich, MD 09/09/18 [id number]

## 2018-09-09 NOTE — ED Triage Notes (Addendum)
Pt reports Headache earlier today and she took ibuprofen with relief. Approx 3 hours ago she began feeling SOB at rest. Speaking full sentences. No distress apparent. O2 sats 100% on ra. Reports recent travel by car to [place]

## 2018-09-25 ENCOUNTER — Ambulatory Visit (INDEPENDENT_AMBULATORY_CARE_PROVIDER_SITE_OTHER): Payer: Federal, State, Local not specified - PPO | Admitting: Osteopathic Medicine

## 2018-09-25 ENCOUNTER — Encounter: Payer: Self-pay | Admitting: Osteopathic Medicine

## 2018-09-25 ENCOUNTER — Other Ambulatory Visit: Payer: Self-pay

## 2018-09-25 VITALS — BP 108/67 | HR 82 | Temp 98.5°F | Wt 172.6 lb

## 2018-09-25 DIAGNOSIS — R790 Abnormal level of blood mineral: Secondary | ICD-10-CM

## 2018-09-25 DIAGNOSIS — R1013 Epigastric pain: Secondary | ICD-10-CM

## 2018-09-25 DIAGNOSIS — R748 Abnormal levels of other serum enzymes: Secondary | ICD-10-CM

## 2018-09-25 MED ORDER — ONDANSETRON 8 MG PO TBDP: 8.0000 mg | ORAL_TABLET | Freq: Three times a day (TID) | ORAL | 3 refills | Status: DC | PRN

## 2018-09-25 MED ORDER — OMEPRAZOLE 40 MG PO CPDR: 40.0000 mg | DELAYED_RELEASE_CAPSULE | Freq: Every day | ORAL | 0 refills | Status: DC

## 2018-09-25 NOTE — Patient Instructions (Addendum)
Plan:  Will trial antacid medications and antinausea medications for 8-12 weeks. I think this is mostly acid reflux / gastritis irritation. If not better, or if worse/change, will send to GI specialist to probably look with a camera into the stomach.   Will recheck labs. Calcium as a little low, Lipase (pancreas enzyme) was a little high and Surette blood cells (infectoin, inflammation, stress) were also slightly high.   For feet, I think we can send to podiatrist to discuss custom orthotics for likely muscle/tendon pain. Can take Tylenol up to maximum 1000 mg 3-4 times per day. Compression socks might also help.

## 2018-09-25 NOTE — Progress Notes (Signed)
HPI: Kirsten Cooper is a 38 y.o. female who  has no past medical history on file.  she presents to [facility] today, 09/25/18,  for chief complaint of: Abdominal pain, chest tightness Foot pain  ER follow-up for episode of chest tightness. This was 09/09/18 (about 2 weeks ago) and a GI cocktail in ER improved symptoms, lying supine caused recurrence of symptoms. Labs including D-Dimer and tropes were negative. Recommended PPI and Maalox. As of today pt is not taking any medications    Foot pain bilaterally at soles of feet, hurts to walk.       Past medical, surgical, social and family history reviewed:  Patient Active Problem List   Diagnosis Date Noted  . Exertional headache 04/18/2018  . Memory loss or impairment 01/25/2018    Past Surgical History:  Procedure Laterality Date  . CESAREAN SECTION      Social History   Tobacco Use  . Smoking status: Never Smoker  . Smokeless tobacco: Never Used  Substance Use Topics  . Alcohol use: Never    Frequency: Never    No family history on file.   Current medication list and allergy/intolerance information reviewed:    No current outpatient medications on file.   No current facility-administered medications for this visit.     No Known Allergies    Review of Systems:  Constitutional:  No  fever, no chills, +recent illness per HPI, No unintentional weight changes. No significant fatigue.   HEENT: No  headache, no vision change  Cardiac: No  chest pain, No  pressure, No palpitations, No  Orthopnea  Respiratory:  No  shortness of breath. No  Cough  Gastrointestinal: No  abdominal pain, No  nausea, No  vomiting,  No  blood in stool, No  diarrhea, No  constipation   Musculoskeletal: +new myalgia/arthralgia - foot pain as per HPI   Skin: No  Rash  Neurologic: No  weakness, No  dizziness,  Psychiatric: No  concerns with depression, No  concerns with anxiety, No sleep problems,  No mood problems  Exam:  BP 108/67 (BP Location: Left Arm, Patient Position: Sitting, Cuff Size: Normal)   Pulse 82   Temp 98.5 F (36.9 C) (Oral)   Wt 172 lb 9.6 oz (78.3 kg)   BMI 31.57 kg/m   Constitutional: VS see above. General Appearance: alert, well-developed, well-nourished, NAD  Eyes: Normal lids and conjunctive, non-icteric sclera  Neck: No masses, trachea midline. No thyroid enlargement.  Respiratory: Normal respiratory effort. no wheeze, no rhonchi, no rales  Cardiovascular: S1/S2 normal, no murmur, no rub/gallop auscultated. RRR. No lower extremity edema.   Gastrointestinal: +epigsatric tenderness, no masses. No hepatomegaly, no splenomegaly. No hernia appreciated. Bowel sounds normal. Rectal exam deferred.   Musculoskeletal: Gait normal. No clubbing/cyanosis of digits.   Neurological: Normal balance/coordination. No tremor.  Psychiatric: Normal judgment/insight. Normal mood and affect. Oriented x3.     Recent Results (from the past 2160 hour(s))  CBC with Differential/Platelet     Status: Abnormal   Collection Time: 09/09/18  9:13 PM  Result Value Ref Range   WBC 11.5 (H) 4.0 - 10.5 K/uL   RBC 4.77 3.87 - 5.11 MIL/uL   Hemoglobin 13.8 12.0 - 15.0 g/dL   HCT 16.143.3 09.636.0 - 04.546.0 %   MCV 90.8 80.0 - 100.0 fL   MCH 28.9 26.0 - 34.0 pg   MCHC 31.9 30.0 - 36.0 g/dL   RDW 40.912.6 81.111.5 - 91.415.5 %  Platelets 270 150 - 400 K/uL   nRBC 0.0 0.0 - 0.2 %   Neutrophils Relative % 60 %   Neutro Abs 7.1 1.7 - 7.7 K/uL   Lymphocytes Relative 26 %   Lymphs Abs 3.0 0.7 - 4.0 K/uL   Monocytes Relative 10 %   Monocytes Absolute 1.1 (H) 0.1 - 1.0 K/uL   Eosinophils Relative 2 %   Eosinophils Absolute 0.2 0.0 - 0.5 K/uL   Basophils Relative 1 %   Basophils Absolute 0.1 0.0 - 0.1 K/uL   Immature Granulocytes 1 %   Abs Immature Granulocytes 0.07 0.00 - 0.07 K/uL    Comment: Performed at [facility] [place], [address] [place], [place] [address]  Comprehensive metabolic  panel     Status: Abnormal   Collection Time: 09/09/18  9:13 PM  Result Value Ref Range   Sodium 136 135 - 145 mmol/L   Potassium 3.9 3.5 - 5.1 mmol/L   Chloride 107 98 - 111 mmol/L   CO2 21 (L) 22 - 32 mmol/L   Glucose, Bld 93 70 - 99 mg/dL   BUN 9 6 - 20 mg/dL   Creatinine, Ser 8.460.76 0.44 - 1.00 mg/dL   Calcium 8.4 (L) 8.9 - 10.3 mg/dL   Total Protein 6.9 6.5 - 8.1 g/dL   Albumin 3.7 3.5 - 5.0 g/dL   AST 23 15 - 41 U/L   ALT 11 0 - 44 U/L   Alkaline Phosphatase 124 38 - 126 U/L   Total Bilirubin 0.6 0.3 - 1.2 mg/dL   GFR calc non Af Amer >60 >60 mL/min   GFR calc Af Amer >60 >60 mL/min   Anion gap 8 5 - 15    Comment: Performed at [facility] [place], [address] [place], [place] [address]  Lipase, blood     Status: Abnormal   Collection Time: 09/09/18  9:13 PM  Result Value Ref Range   Lipase 71 (H) 11 - 51 U/L    Comment: Performed at [facility] [place], [address] [place], [place] [address]  D-dimer, quantitative (not at [facility])     Status: None   Collection Time: 09/09/18  9:13 PM  Result Value Ref Range   D-Dimer, Quant 0.43 0.00 - 0.50 ug/mL-FEU    Comment: (NOTE) At the manufacturer cut-off of 0.50 ug/mL FEU, this assay has been documented to exclude PE with a sensitivity and negative predictive value of 97 to 99%.  At this time, this assay has not been approved by the FDA to exclude DVT/VTE. Results should be correlated with clinical presentation. Performed at [facility] [place], [address] [place], [place] [address]   Troponin I (High Sensitivity)     Status: None   Collection Time: 09/09/18  9:13 PM  Result Value Ref Range   Troponin I (High Sensitivity) <2 <18 ng/L    Comment: (NOTE) Elevated high sensitivity troponin I (hsTnI) values and significant  changes across serial measurements may suggest ACS but many other  chronic and acute conditions are known to elevate hsTnI results.  Refer to the Links section for chest pain  algorithms and additional  guidance. Performed at [facility] [place], [address] [place], [place] [address]   Lipase     Status: Abnormal   Collection Time: 09/25/18  3:59 PM  Result Value Ref Range   Lipase 92 (H) 7 - 60 U/L  COMPLETE METABOLIC PANEL WITH  GFR     Status: None   Collection Time: 09/25/18  3:59 PM  Result Value Ref Range   Glucose, Bld 72 65 - 99 mg/dL    Comment: .            Fasting reference interval .    BUN 12 7 - 25 mg/dL   Creat 1.611.01 0.960.50 - 0.451.10 mg/dL   GFR, Est Non African American 70 > OR = 60 mL/min/1.2573m2   GFR, Est African American 81 > OR = 60 mL/min/1.273m2   BUN/Creatinine Ratio NOT APPLICABLE 6 - 22 (calc)   Sodium 137 135 - 146 mmol/L   Potassium 4.3 3.5 - 5.3 mmol/L   Chloride 104 98 - 110 mmol/L   CO2 23 20 - 32 mmol/L   Calcium 9.2 8.6 - 10.2 mg/dL   Total Protein 6.7 6.1 - 8.1 g/dL   Albumin 4.0 3.6 - 5.1 g/dL   Globulin 2.7 1.9 - 3.7 g/dL (calc)   AG Ratio 1.5 1.0 - 2.5 (calc)   Total Bilirubin 0.4 0.2 - 1.2 mg/dL   Alkaline phosphatase (APISO) 100 31 - 125 U/L   AST 20 10 - 30 U/L   ALT 13 6 - 29 U/L       ASSESSMENT/PLAN: The primary encounter diagnosis was Epigastric pain. Diagnoses of Elevated lipase and Decreased blood calcium were also pertinent to this visit.   Orders Placed This Encounter  Procedures  . CT Abdomen Pelvis W Contrast  . Lipase  . COMPLETE METABOLIC PANEL WITH GFR  . Lipase    Meds ordered this encounter  Medications  . omeprazole (PRILOSEC) 40 MG capsule    Sig: Take 1 capsule (40 mg total) by mouth daily. For 8-12 weeks    Dispense:  90 capsule    Refill:  0  . ondansetron (ZOFRAN-ODT) 8 MG disintegrating tablet    Sig: Take 1 tablet (8 mg total) by mouth every 8 (eight) hours as needed for nausea.    Dispense:  20 tablet    Refill:  3    Patient Instructions  Plan:  Will trial antacid medications and antinausea medications for 8-12 weeks. I think this is mostly acid reflux /  gastritis irritation. If not better, or if worse/change, will send to GI specialist to probably look with a camera into the stomach.   Will recheck labs. Calcium as a little low, Lipase (pancreas enzyme) was a little high and Idler blood cells (infectoin, inflammation, stress) were also slightly high.   For feet, I think we can send to podiatrist to discuss custom orthotics for likely muscle/tendon pain. Can take Tylenol up to maximum 1000 mg 3-4 times per day. Compression socks might also help.       From Result note 09/26/18:   MyChart message sent:   Labs look better except for the lipase/pancreatic enzyme. It is actually a bit increased from a couple weeks ago but the elevation is mild - we typically don't diagnose pancreatitis until the levels are in the 180's, your are in the 90's. Inflammation of pancreas could explain the nausea issues.   At this point, we can get a CT of the abdomen to look at the pancreas, and also we can recheck the blood levels in a week. I put in the orders for the CT and for the blood work.   Please maintain a bland diet and drink plenty of water. Most pancreatitis resolves, will know more after the CT. If any questions, let  me know!    Visit summary with medication list and pertinent instructions was printed for patient to review. All questions at time of visit were answered - patient instructed to contact office with any additional concerns or updates. ER/RTC precautions were reviewed with the patient.   Note: Total time spent 40 minutes, greater than 50% of the visit was spent face-to-face counseling and coordinating care for the above diagnoses listed in assessment/plan.   Please note: voice recognition software was used to produce this document, and typos may escape review. Please contact Dr. Sheppard Coil for any needed clarifications.     Follow-up plan: Return if symptoms worsen or fail to improve.

## 2018-09-26 LAB — COMPLETE METABOLIC PANEL WITH GFR
AG Ratio: 1.5 (calc) (ref 1.0–2.5)
ALT: 13 U/L (ref 6–29)
AST: 20 U/L (ref 10–30)
Albumin: 4 g/dL (ref 3.6–5.1)
Alkaline phosphatase (APISO): 100 U/L (ref 31–125)
BUN: 12 mg/dL (ref 7–25)
CO2: 23 mmol/L (ref 20–32)
Calcium: 9.2 mg/dL (ref 8.6–10.2)
Chloride: 104 mmol/L (ref 98–110)
Creat: 1.01 mg/dL (ref 0.50–1.10)
GFR, Est African American: 81 mL/min/{1.73_m2} (ref >=60)
GFR, Est Non African American: 70 mL/min/{1.73_m2} (ref >=60)
Globulin: 2.7 g/dL (calc) (ref 1.9–3.7)
Glucose, Bld: 72 mg/dL (ref 65–99)
Potassium: 4.3 mmol/L (ref 3.5–5.3)
Sodium: 137 mmol/L (ref 135–146)
Total Bilirubin: 0.4 mg/dL (ref 0.2–1.2)
Total Protein: 6.7 g/dL (ref 6.1–8.1)

## 2018-09-26 LAB — LIPASE: Lipase: 92 U/L — ABNORMAL HIGH (ref 7–60)

## 2018-09-27 ENCOUNTER — Other Ambulatory Visit: Payer: Self-pay

## 2018-09-27 ENCOUNTER — Ambulatory Visit (INDEPENDENT_AMBULATORY_CARE_PROVIDER_SITE_OTHER): Payer: Federal, State, Local not specified - PPO

## 2018-09-27 DIAGNOSIS — N3289 Other specified disorders of bladder: Secondary | ICD-10-CM | POA: Diagnosis not present

## 2018-09-27 DIAGNOSIS — R748 Abnormal levels of other serum enzymes: Secondary | ICD-10-CM | POA: Diagnosis not present

## 2018-09-27 DIAGNOSIS — R195 Other fecal abnormalities: Secondary | ICD-10-CM | POA: Diagnosis not present

## 2018-09-27 DIAGNOSIS — N281 Cyst of kidney, acquired: Secondary | ICD-10-CM | POA: Diagnosis not present

## 2018-09-27 DIAGNOSIS — R1013 Epigastric pain: Secondary | ICD-10-CM | POA: Diagnosis not present

## 2018-09-27 MED ORDER — IOHEXOL 300 MG/ML  SOLN
100.0000 mL | Freq: Once | INTRAMUSCULAR | Status: AC | PRN
  Administered 2018-09-27: 100 mL via INTRAVENOUS

## 2018-10-04 LAB — LIPASE: Lipase: 76 U/L — ABNORMAL HIGH (ref 7–60)

## 2018-10-04 NOTE — Addendum Note (Signed)
Addended by: Maryla Morrow on: 10/04/2018 07:21 AM   Modules accepted: Orders

## 2019-02-05 ENCOUNTER — Telehealth: Payer: Self-pay | Admitting: Osteopathic Medicine

## 2019-02-05 NOTE — Telephone Encounter (Signed)
Patient advised of this information 

## 2019-02-05 NOTE — Telephone Encounter (Signed)
Upcoming is not a physical, it is an office visit to discuss specific issues. Patient must be seen and evaluated prior to ordering labs

## 2019-02-05 NOTE — Telephone Encounter (Signed)
Patient was wanting Labs prior to visit with PCP on 02/13/19, states she is feeling a bit of fatigue lately and wanted blood checked and would like a call back regarding this. Please Advise.

## 2019-02-13 ENCOUNTER — Encounter: Payer: Self-pay | Admitting: Osteopathic Medicine

## 2019-02-13 ENCOUNTER — Ambulatory Visit (INDEPENDENT_AMBULATORY_CARE_PROVIDER_SITE_OTHER): Payer: Federal, State, Local not specified - PPO | Admitting: Osteopathic Medicine

## 2019-02-13 VITALS — Wt 182.0 lb

## 2019-02-13 DIAGNOSIS — M79671 Pain in right foot: Secondary | ICD-10-CM | POA: Diagnosis not present

## 2019-02-13 DIAGNOSIS — R5382 Chronic fatigue, unspecified: Secondary | ICD-10-CM | POA: Diagnosis not present

## 2019-02-13 DIAGNOSIS — R4189 Other symptoms and signs involving cognitive functions and awareness: Secondary | ICD-10-CM

## 2019-02-13 DIAGNOSIS — M79672 Pain in left foot: Secondary | ICD-10-CM

## 2019-02-13 NOTE — Patient Instructions (Signed)
Plan:  Memory issues/cognitive concerns: I have placed referral to neurology and we will get blood work to evaluate potential organic causes for your issues.  Please let me know if you do not hear back from neurology about getting an appointment set up  Foot pain: I have placed referral to podiatry, please let me know if you do not hear back from them about getting an appointment set up  I should have lab results back within a few days of your blood draw.  If it has been a week and you have not heard from you, please let me know!  Return for RECHECK PENDING LAB RESULTS AND NEUROLOGY CONSULT / IF WORSE OR CHANGE.

## 2019-02-13 NOTE — Progress Notes (Signed)
Virtual Visit via Video (App used: Doximity) Note  I connected with      Kirsten Cooper on 02/13/19 at 4:31 PM by a telemedicine application and verified that I am speaking with the correct person using two identifiers.  Patient is at home I am in office   I discussed the limitations of evaluation and management by telemedicine and the availability of in person appointments. The patient expressed understanding and agreed to proceed.  History of Present Illness: Kirsten Cooper is a 38 y.o. female who would like to discuss fatigue, mental fogginess   Overall health is ok but she's noticing a pattern of past 2 weeks experiencing some itching ankles around achilles tendon, usually at night most days. Energy feels low, feeling exhausted w/ minimal activity. Occasional twitching in lower R eyelid or feeling like a cramp in the orbital, notes this maybe a few times per week for a moment then. No major changes to appetite/exercise, no increased stressors. She's drinking a lot more water.   Foot pain about the same, requests referral to podiatry  Concern with memory lapses, progressing over past few years. Has noted language deficits, feels like she's having word-finding difficulty. Struggling with basic math.  Feeling like she is incredibly forgetful, worst of it was when she left the house during heaviest day of her period and did not place a pad or tampon prior to leaving the house.  Requests referral to specialist for further evaluation.  No history of ADHD/learning disorder, denies mental health issues with depression/anxiety, no family history of early onset Alzheimer's or other significant memory deficit or mental health illness.    Depression screen [facility] 2/9 02/13/2019 04/18/2018 01/25/2018  Decreased Interest 0 0 0  Down, Depressed, Hopeless 0 0 0  PHQ - 2 Score 0 0 0  Altered sleeping - 0 0  Tired, decreased energy - 0 0  Change in appetite - 0 1  Feeling bad or failure about yourself  -  0 0  Trouble concentrating - 1 0  Moving slowly or fidgety/restless - 0 0  Suicidal thoughts - 0 0  PHQ-9 Score - 1 1  Difficult doing work/chores - Somewhat difficult Not difficult at all         Observations/Objective: Wt 182 lb (82.6 kg)   BMI 33.29 kg/m  BP Readings from Last 3 Encounters:  09/25/18 108/67  09/09/18 102/83  04/18/18 110/67   Exam: Normal Speech.  NAD  Lab and Radiology Results No results found for this or any previous visit (from the past 72 hour(s)). No results found.     Assessment and Plan: 38 y.o. female with The primary encounter diagnosis was Cognitive deficits. Diagnoses of Chronic fatigue and Pain in both feet were also pertinent to this visit.   PDMP not reviewed this encounter. Orders Placed This Encounter  Procedures  . CBC  . COMPLETE METABOLIC PANEL WITH GFR  . TSH  . T4, FREE  . A1C  . VITAMIN D 25  . Urinalysis, Routine w reflex microscopic  . B12 and Folate Panel  . Fe+TIBC+Fer  . HIV antibody  . RPR  . Ambulatory referral to Neurology    Referral Priority:   Routine    Referral Type:   Consultation    Referral Reason:   Specialty Services Required    Requested Specialty:   Neurology    Number of Visits Requested:   1  . Ambulatory referral to Podiatry    Referral Priority:  Routine    Referral Type:   Consultation    Referral Reason:   Specialty Services Required    Requested Specialty:   Podiatry    Number of Visits Requested:   1   No orders of the defined types were placed in this encounter.  Patient Instructions  Plan:  Memory issues/cognitive concerns: I have placed referral to neurology and we will get blood work to evaluate potential organic causes for your issues.  Please let me know if you do not hear back from neurology about getting an appointment set up  Foot pain: I have placed referral to podiatry, please let me know if you do not hear back from them about getting an appointment set up  I  should have lab results back within a few days of your blood draw.  If it has been a week and you have not heard from you, please let me know!  Return for RECHECK PENDING LAB RESULTS AND NEUROLOGY CONSULT / IF WORSE OR CHANGE.    Instructions sent via MyChart. If MyChart not available, pt was given option for info via personal e-mail w/ no guarantee of protected health info over unsecured e-mail communication, and MyChart sign-up instructions were sent to patient.   Follow Up Instructions: Return for RECHECK PENDING LAB RESULTS AND NEUROLOGY CONSULT / IF WORSE OR CHANGE.    I discussed the assessment and treatment plan with the patient. The patient was provided an opportunity to ask questions and all were answered. The patient agreed with the plan and demonstrated an understanding of the instructions.   The patient was advised to call back or seek an in-person evaluation if any new concerns, if symptoms worsen or if the condition fails to improve as anticipated.  30 minutes of non-face-to-face time was provided during this encounter.      . . . . . . . . . . . . . Marland Kitchen                   Historical information moved to improve visibility of documentation.  No past medical history on file. Past Surgical History:  Procedure Laterality Date  . CESAREAN SECTION     Social History   Tobacco Use  . Smoking status: Never Smoker  . Smokeless tobacco: Never Used  Substance Use Topics  . Alcohol use: Never    Frequency: Never   family history is not on file.  Medications: Current Outpatient Medications  Medication Sig Dispense Refill  . omeprazole (PRILOSEC) 40 MG capsule Take 1 capsule (40 mg total) by mouth daily. For 8-12 weeks (Patient not taking: Reported on 02/13/2019) 90 capsule 0  . ondansetron (ZOFRAN-ODT) 8 MG disintegrating tablet Take 1 tablet (8 mg total) by mouth every 8 (eight) hours as needed for nausea. (Patient not taking: Reported on  02/13/2019) 20 tablet 3   No current facility-administered medications for this visit.    No Known Allergies

## 2019-02-14 LAB — URINALYSIS, ROUTINE W REFLEX MICROSCOPIC
Bacteria, UA: NONE SEEN /HPF
Bilirubin Urine: NEGATIVE
Glucose, UA: NEGATIVE
Hyaline Cast: NONE SEEN /LPF
Ketones, ur: NEGATIVE
Nitrite: NEGATIVE
Specific Gravity, Urine: 1.006 (ref 1.001–1.03)
Squamous Epithelial / HPF: NONE SEEN /HPF (ref <=5)
WBC, UA: NONE SEEN /HPF (ref 0–5)
pH: 6.5 (ref 5.0–8.0)

## 2019-02-14 LAB — COMPLETE METABOLIC PANEL WITH GFR
AG Ratio: 1.3 (calc) (ref 1.0–2.5)
ALT: 12 U/L (ref 6–29)
AST: 13 U/L (ref 10–30)
Albumin: 3.8 g/dL (ref 3.6–5.1)
Alkaline phosphatase (APISO): 127 U/L — ABNORMAL HIGH (ref 31–125)
BUN: 12 mg/dL (ref 7–25)
CO2: 27 mmol/L (ref 20–32)
Calcium: 9.1 mg/dL (ref 8.6–10.2)
Chloride: 104 mmol/L (ref 98–110)
Creat: 1.06 mg/dL (ref 0.50–1.10)
GFR, Est African American: 76 mL/min/{1.73_m2} (ref >=60)
GFR, Est Non African American: 66 mL/min/{1.73_m2} (ref >=60)
Globulin: 3 g/dL (calc) (ref 1.9–3.7)
Glucose, Bld: 80 mg/dL (ref 65–99)
Potassium: 4.2 mmol/L (ref 3.5–5.3)
Sodium: 137 mmol/L (ref 135–146)
Total Bilirubin: 0.3 mg/dL (ref 0.2–1.2)
Total Protein: 6.8 g/dL (ref 6.1–8.1)

## 2019-02-14 LAB — CBC
HCT: 41 % (ref 35.0–45.0)
Hemoglobin: 13.5 g/dL (ref 11.7–15.5)
MCH: 28.6 pg (ref 27.0–33.0)
MCHC: 32.9 g/dL (ref 32.0–36.0)
MCV: 86.9 fL (ref 80.0–100.0)
MPV: 10.5 fL (ref 7.5–12.5)
Platelets: 315 10*3/uL (ref 140–400)
RBC: 4.72 10*6/uL (ref 3.80–5.10)
RDW: 12.6 % (ref 11.0–15.0)
WBC: 9.6 10*3/uL (ref 3.8–10.8)

## 2019-02-14 LAB — HEMOGLOBIN A1C
Hgb A1c MFr Bld: 5.3 % of total Hgb (ref <5.7)
Mean Plasma Glucose: 105 (calc)
eAG (mmol/L): 5.8 (calc)

## 2019-02-14 LAB — IRON,TIBC AND FERRITIN PANEL
%SAT: 16 % (calc) (ref 16–45)
Ferritin: 32 ng/mL (ref 16–154)
Iron: 46 ug/dL (ref 40–190)
TIBC: 296 mcg/dL (calc) (ref 250–450)

## 2019-02-14 LAB — HIV ANTIBODY (ROUTINE TESTING W REFLEX): HIV 1&2 Ab, 4th Generation: NONREACTIVE

## 2019-02-14 LAB — VITAMIN D 25 HYDROXY (VIT D DEFICIENCY, FRACTURES): Vit D, 25-Hydroxy: 33 ng/mL (ref 30–100)

## 2019-02-14 LAB — RPR: RPR Ser Ql: NONREACTIVE

## 2019-02-14 LAB — B12 AND FOLATE PANEL
Folate: >24 ng/mL
Vitamin B-12: 1186 pg/mL — ABNORMAL HIGH (ref 200–1100)

## 2019-02-14 LAB — T4, FREE: Free T4: 1.2 ng/dL (ref 0.8–1.8)

## 2019-02-14 LAB — TSH: TSH: 1.09 mIU/L

## 2019-02-15 ENCOUNTER — Ambulatory Visit: Payer: Federal, State, Local not specified - PPO | Admitting: Neurology

## 2019-02-15 ENCOUNTER — Encounter: Payer: Self-pay | Admitting: Neurology

## 2019-02-15 ENCOUNTER — Other Ambulatory Visit: Payer: Self-pay

## 2019-02-15 VITALS — BP 128/80 | HR 81 | Ht 62.5 in | Wt 179.0 lb

## 2019-02-15 DIAGNOSIS — R200 Anesthesia of skin: Secondary | ICD-10-CM

## 2019-02-15 DIAGNOSIS — R4189 Other symptoms and signs involving cognitive functions and awareness: Secondary | ICD-10-CM

## 2019-02-15 NOTE — Progress Notes (Signed)
NEUROLOGY CONSULTATION NOTE  Kirsten Cooper MRN: [id number] DOB: [birth year]  Referring provider: Dr. Sunnie Nielsen Primary care provider: Dr. Sunnie Nielsen  Reason for consult:  Memory lapses  Dear Dr Lyn Hollingshead:  Thank you for your kind referral of Kirsten Cooper for consultation of the above symptoms. Although her history is well known to you, please allow me to reiterate it for the purpose of our medical record. Records and images were personally reviewed where available.  HISTORY OF PRESENT ILLNESS: This is a 38 year old right-handed woman with no significant past medical history presenting for evaluation of memory lapses. She started noticing changes in her thinking around 2011/2012 where thinking was foggy and she could not think clearly throughout the day. She could not get access to the information in her head. She thought it was stress-related at that time, then towards her graduation in 2018, symptoms were really terrible, information was just looping. She had to pull herself out of conversations. She denied any speech difficulties. She would be looking at arithmetic and feel intimidated, having to focus more. She feels symptoms have overall been unchanged since 2018. 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 She moved from [place] to [place] in August 2019. She lives with her husband and 43 year old daughter. Her husband would mention them seeing a movie or family events that she had no recollection about. As she watches the movie, she starts remembering some of it. She occasionally misses bill payments and has a better system now. She has gotten lost driving, starting off with the information, then having to think where she is going. She is not on any regular medications. She states she has always been "hyper/active" and did not like reading. She notes an incident when she had congestion and took Mucinex DM, making her feel  fully aware and precise. Her husband has told her she stares off sometimes. She denies any gaps in time, olfactory/gustatory hallucinations, myoclonic jerks. She has occasional right arm and leg numbness and tingling. A few months ago, she had weakness on one side that went away. She has noticed occasional twitching on the left lower eyelid. No headaches, dizziness, diplopia, dysarthria/dysphagia, neck/back pain, bowel/bladder dysfunction, anosmia, or tremors. Sleep is getting better, she used to sleep 4-5 hours, but with lifestyle changes she sleeps 7 hours now and feels refreshed with no daytime drowsiness. She feels her mood can be better, "on and off since younger," she has not been on any psychiatric medications in the past. No falls. No history of dementia, significant head injuries, alcohol use. She was in foster care when younger. She had a normal birth and early development, no history of febrile seizures, CNS infections, family history of seizures.   I personally reviewed MRI brain without contrast done 04/2018 which was normal.  Laboratory Data: Lab Results  Component Value Date   TSH 1.09 02/13/2019   Lab Results  Component Value Date   VITAMINB12 1,186 (H) 02/13/2019     PAST MEDICAL HISTORY: No past medical history on file.  PAST SURGICAL HISTORY: Past Surgical History:  Procedure Laterality Date   CESAREAN SECTION      MEDICATIONS: Current Outpatient Medications on File Prior to Visit  Medication Sig Dispense Refill   Ascorbic Acid (VITAMIN C PO) Take by mouth daily.     loratadine (CLARITIN) 10 MG tablet Take 10 mg by mouth as needed for allergies.     Multiple  Vitamin (MULTIVITAMIN) tablet Take 1 tablet by mouth daily.     NIACIN PO Take by mouth daily.     VITAMIN D PO Take by mouth daily.     No current facility-administered medications on file prior to visit.     ALLERGIES: No Known Allergies  FAMILY HISTORY: History reviewed. No pertinent family  history.  SOCIAL HISTORY: Social History   Socioeconomic History   Marital status: Married    Spouse name: Not on file   Number of children: 2   Years of education: Not on file   Highest education level: Not on file  Occupational History   Occupation: Advice workerffice Assistant     Employer: [employer]  Social Needs   Financial resource strain: Not on file   Food insecurity    Worry: Not on file    Inability: Not on file   Transportation needs    Medical: Not on file    Non-medical: Not on file  Tobacco Use   Smoking status: Never Smoker   Smokeless tobacco: Never Used  Substance and Sexual Activity   Alcohol use: Never    Frequency: Never   Drug use: Never   Sexual activity: Yes    Partners: Male    Birth control/protection: None  Lifestyle   Physical activity    Days per week: Not on file    Minutes per session: Not on file   Stress: Not on file  Relationships   Social connections    Talks on phone: Not on file    Gets together: Not on file    Attends religious service: Not on file    Active member of club or organization: Not on file    Attends meetings of clubs or organizations: Not on file    Relationship status: Not on file   Intimate partner violence    Fear of current or ex partner: Not on file    Emotionally abused: Not on file    Physically abused: Not on file    Forced sexual activity: Not on file  Other Topics Concern   Not on file  Social History Narrative   Not on file    REVIEW OF SYSTEMS: Constitutional: No fevers, chills, or sweats, no generalized fatigue, change in appetite Eyes: No visual changes, double vision, eye pain Ear, nose and throat: No hearing loss, ear pain, nasal congestion, sore throat Cardiovascular: No chest pain, palpitations Respiratory:  No shortness of breath at rest or with exertion, wheezes GastrointestinaI: No nausea, vomiting, diarrhea, abdominal pain, fecal incontinence Genitourinary:  No dysuria, urinary  retention or frequency Musculoskeletal:  No neck pain, back pain Integumentary: No rash, pruritus, skin lesions Neurological: as above Psychiatric: No depression, insomnia, anxiety Endocrine: No palpitations, fatigue, diaphoresis, mood swings, change in appetite, change in weight, increased thirst Hematologic/Lymphatic:  No anemia, purpura, petechiae. Allergic/Immunologic: no itchy/runny eyes, nasal congestion, recent allergic reactions, rashes  PHYSICAL EXAM: Vitals:   02/15/19 1418  BP: 128/80  Pulse: 81  SpO2: 97%   General: No acute distress Head:  Normocephalic/atraumatic Skin/Extremities: No rash, no edema Neurological Exam: Mental status: alert and oriented to person, place, and time, no dysarthria or aphasia, Fund of knowledge is appropriate.  Recent and remote memory are intact.  Attention and concentration are normal. Difficulty with clock drawing. SLUMS 20/30 [employer] Mental Exam 02/15/2019  Weekday Correct 1  Current year 1  What state are we in? 1  Amount spent 1  Amount left 0  # of Animals  2  5 objects recall 3  Number series 2  Hour markers 1  Time correct 0  Placed X in triangle correctly 1  Largest Figure 1  Name of female 2  Date back to work 2  Type of work [employer] she lived in 2  Total score 20   Cranial nerves: CN I: not tested CN II: pupils equal, round and reactive to light, visual fields intact CN III, IV, VI:  full range of motion, no nystagmus, no ptosis CN V: facial sensation intact CN VII: upper and lower face symmetric CN VIII: hearing intact to conversation CN IX, X: gag intact, uvula midline CN XI: sternocleidomastoid and trapezius muscles intact CN XII: tongue midline Bulk & Tone: normal, no fasciculations. Motor: 5/5 throughout with no pronator drift. Sensation: intact to light touch, cold, pin, vibration and joint position sense.  No extinction to double simultaneous stimulation.  Romberg test negative Deep Tendon  Reflexes: +2 throughout, no ankle clonus Plantar responses: downgoing bilaterally Cerebellar: no incoordination on finger to nose testing Gait: narrow-based and steady, able to tandem walk adequately. Tremor: none  IMPRESSION: This is a 38 year old right-handed woman with no significant past medical history presenting for evaluation of cognitive changes that started around 8-10 years ago where she describes difficulty with concentrating/focusing and word-finding difficulties. Her husband has mentioned staring off at times, she reports intermittent right-sided numbness and a transient episode of one-sided weakness. Neurological exam normal, SLUMS score today 20/30. We discussed different causes of cognitive changes, including mood. MRI brain normal. EEG will be ordered. She will be scheduled for Neuropsychological evaluation to further evaluate cognitive complaints. Follow-up after tests, she knows to call for any changes.  Thank you for allowing me to participate in the care of this patient. Please do not hesitate to call for any questions or concerns.   Ellouise Newer, M.D.  CC: Dr. Sheppard Coil

## 2019-02-15 NOTE — Patient Instructions (Signed)
1. Schedule routine EEG  2. Schedule Neurocognitive testing   You have been referred for a neurocognitive evaluation in our office.   The evaluation has two parts.   . The first part of the evaluation is a clinical interview with the neuropsychologist (Dr. Melvyn Novas or Dr. Nicole Kindred). Please bring someone with you to this appointment if possible, as it is helpful for the doctor to hear from both you and another adult who knows you well.   . The second part of the evaluation is testing with the doctor's technician Hinton Dyer or Maudie Mercury). The testing includes a variety of tasks- mostly question-and-answer, some paper-and-pencil. There is nothing you need to do to prepare for this appointment, but having a good night's sleep prior to the testing, taking medications as you normally would, and bringing eyeglasses and hearing aids (if you wear them), is advised. Please make sure that you wear a mask to the appointment.  Please note: We have to reserve several hours of the neuropsychologist's time and the psychometrician's time for your evaluation appointment. As such, please note that there is a No-Show fee of $100. If you are unable to attend any of your appointments, please contact our office as soon as possible to reschedule.

## 2019-02-18 ENCOUNTER — Other Ambulatory Visit: Payer: Federal, State, Local not specified - PPO

## 2019-02-22 ENCOUNTER — Ambulatory Visit (INDEPENDENT_AMBULATORY_CARE_PROVIDER_SITE_OTHER): Payer: Federal, State, Local not specified - PPO

## 2019-02-22 ENCOUNTER — Encounter: Payer: Self-pay | Admitting: Podiatry

## 2019-02-22 ENCOUNTER — Ambulatory Visit (INDEPENDENT_AMBULATORY_CARE_PROVIDER_SITE_OTHER): Payer: Federal, State, Local not specified - PPO | Admitting: Podiatry

## 2019-02-22 ENCOUNTER — Other Ambulatory Visit: Payer: Self-pay

## 2019-02-22 ENCOUNTER — Other Ambulatory Visit: Payer: Self-pay | Admitting: Podiatry

## 2019-02-22 VITALS — BP 109/60

## 2019-02-22 DIAGNOSIS — G5761 Lesion of plantar nerve, right lower limb: Secondary | ICD-10-CM

## 2019-02-22 DIAGNOSIS — M722 Plantar fascial fibromatosis: Secondary | ICD-10-CM

## 2019-02-22 DIAGNOSIS — M2141 Flat foot [pes planus] (acquired), right foot: Secondary | ICD-10-CM

## 2019-02-22 DIAGNOSIS — M79671 Pain in right foot: Secondary | ICD-10-CM | POA: Diagnosis not present

## 2019-02-22 DIAGNOSIS — M2142 Flat foot [pes planus] (acquired), left foot: Secondary | ICD-10-CM

## 2019-02-22 NOTE — Progress Notes (Addendum)
Subjective:  Patient ID: Kirsten Cooper, female    DOB: 11-Oct-1980,  MRN: [id number]  Chief Complaint  Patient presents with  . Foot Pain    pt is here for bil foot pain, pain is located on the bottom of both feet towards the arches, pt also states that pain is elevated when walking on it as well    38 y.o. female presents with the above complaint.  Patient presents with right foot pain.  Patient states it hurts on the dorsal aspect of the foot as well as the plantar aspect of the foot in the third interspace.  Patient states that the right side is worse than left side.  Patient states been going on for a year year.  She states that pain is elevated when stepping down.  There is some stiffness associated with pain to both feet.  Patient also has flatfeet that she would like to also address at the same time.  She denies any other acute complaints.  She denies seeing anyone else for this.   Review of Systems: Negative except as noted in the HPI. Denies N/V/F/Ch.  No past medical history on file.  Current Outpatient Medications:  .  Ascorbic Acid (VITAMIN C PO), Take by mouth daily., Disp: , Rfl:  .  loratadine (CLARITIN) 10 MG tablet, Take 10 mg by mouth as needed for allergies., Disp: , Rfl:  .  Multiple Vitamin (MULTIVITAMIN) tablet, Take 1 tablet by mouth daily., Disp: , Rfl:  .  NIACIN PO, Take by mouth daily., Disp: , Rfl:  .  VITAMIN D PO, Take by mouth daily., Disp: , Rfl:   Social History   Tobacco Use  Smoking Status Never Smoker  Smokeless Tobacco Never Used    No Known Allergies Objective:   Vitals:   02/22/19 1131  BP: 109/60   There is no height or weight on file to calculate BMI. Constitutional Well developed. Well nourished.  Vascular Dorsalis pedis pulses palpable bilaterally. Posterior tibial pulses palpable bilaterally. Capillary refill normal to all digits.  No cyanosis or clubbing noted. Pedal hair growth normal.  Neurologic Normal speech. Oriented  to person, place, and time. Epicritic sensation to light touch grossly present bilaterally. Mulder's click noted in the right third interspace with tingling to the third and the fourth digit.  Pain on palpation to the right third interspace.  Dermatologic  nails well groomed without any onychomycosis. No skin lesions noted.  Orthopedic: Normal joint ROM without pain or crepitus bilaterally. No visible deformities. No bony tenderness. Upon gait examination weightbearing, calcaneal eversion noted bilaterally with too many toe signs.  Unable to recreate the arch with dorsiflexion of the hallux.   Radiographs: 2 views of skeletally mature adult foot: There is decreasing calcaneal inclination angle increase in talar declination angle Meary angle in good position.  Mild dorsal metatarsal first elevatus noted.  All these findings are consistent with pes planus deformity.  Assessment:   1. Foot pain, right   2. Morton's neuroma, right   3. Pes planus of both feet    Plan:  Patient was evaluated and treated and all questions answered.  Right third interspace Morton's neuroma -I explained to the patient the etiology of Morton's neuroma and various treatment options associated with it.  I explained to her that patient will benefit from a diagnostic block to properly evaluate if this is a Morton's neuroma.  I explained to her that this injection will not last long however I would like  her to know how much relief this injection given.  If her pain is resolved with the injection I will discuss further treatment for Morton's neuroma including excision versus 4% alcohol sclerosing injections  Pes planus rigid -I explained to the patient the etiology of pes planus deformity and various treatment options associated with it including custom-made orthotics to help support the arches of the foot as well as control the hindfoot motion. -Patient will be scheduled to see Liliane Channel to have custom-made orthotics. No  follow-ups on file.

## 2019-02-25 ENCOUNTER — Encounter: Payer: Self-pay | Admitting: Podiatry

## 2019-02-25 ENCOUNTER — Other Ambulatory Visit: Payer: Self-pay

## 2019-02-25 ENCOUNTER — Ambulatory Visit (INDEPENDENT_AMBULATORY_CARE_PROVIDER_SITE_OTHER): Payer: Federal, State, Local not specified - PPO | Admitting: Neurology

## 2019-02-25 DIAGNOSIS — R4189 Other symptoms and signs involving cognitive functions and awareness: Secondary | ICD-10-CM

## 2019-02-25 DIAGNOSIS — R200 Anesthesia of skin: Secondary | ICD-10-CM

## 2019-02-28 ENCOUNTER — Telehealth: Payer: Self-pay | Admitting: Neurology

## 2019-02-28 NOTE — Telephone Encounter (Signed)
Patient called to see if her recent EEG results are ready yet.

## 2019-02-28 NOTE — Telephone Encounter (Signed)
Messaged pt through Mychart and informed that Dr. Delice Lesch has not reviewed results at this time. We will call when reviewed.

## 2019-03-04 ENCOUNTER — Telehealth: Payer: Self-pay | Admitting: Neurology

## 2019-03-04 NOTE — Telephone Encounter (Signed)
Pls let her know the brain wave test was normal, proceed with memory testing (Neurocognitive testing) as discussed, thanks

## 2019-03-04 NOTE — Telephone Encounter (Signed)
Patient called to get her EEG results. Please Call. Thank you

## 2019-03-04 NOTE — Telephone Encounter (Signed)
Pt informed of EEG results and understands to follow up with Neurocognitive testing.

## 2019-03-12 NOTE — Procedures (Signed)
ELECTROENCEPHALOGRAM REPORT  Date of Study: 02/25/2019  Patient's Name: Kirsten Cooper MRN: [id number] Date of Birth: 1980-06-19  Referring Provider: Dr. Ellouise Newer  Clinical History: This is a 38 year old woman with memory lapses, cognitive changes. EEG to assess for subclinical seizures.   Medications: VITAMIN C PO CLARITIN 10 MG tablet NIACIN PO VITAMIN D PO  Technical Summary: A multichannel digital EEG recording measured by the international 10-20 system with electrodes applied with paste and impedances below 5000 ohms performed in our laboratory with EKG monitoring in an awake and asleep patient.  Hyperventilation was not performed. Photic stimulation was performed.  The digital EEG was referentially recorded, reformatted, and digitally filtered in a variety of bipolar and referential montages for optimal display.    Description: The patient is awake and asleep during the recording.  During maximal wakefulness, there is a symmetric, medium voltage 10 Hz posterior dominant rhythm that attenuates with eye opening.  The record is symmetric.  During drowsiness and sleep, there is an increase in theta slowing of the background.  Vertex waves and symmetric sleep spindles were seen.  Photic stimulation produced an excellent photic driving response. There were no epileptiform discharges or electrographic seizures seen.    EKG lead was unremarkable.  Impression: This awake and asleep EEG is normal.    Clinical Correlation: A normal EEG does not exclude a clinical diagnosis of epilepsy.  If further clinical questions remain, prolonged EEG may be helpful.  Clinical correlation is advised.   Ellouise Newer, M.D.

## 2019-03-13 ENCOUNTER — Ambulatory Visit: Payer: Federal, State, Local not specified - PPO | Admitting: Podiatry

## 2019-03-13 ENCOUNTER — Other Ambulatory Visit: Payer: Federal, State, Local not specified - PPO | Admitting: Orthotics

## 2019-03-13 ENCOUNTER — Other Ambulatory Visit: Payer: Self-pay

## 2019-03-13 DIAGNOSIS — G5761 Lesion of plantar nerve, right lower limb: Secondary | ICD-10-CM

## 2019-03-13 DIAGNOSIS — M79671 Pain in right foot: Secondary | ICD-10-CM | POA: Diagnosis not present

## 2019-03-13 DIAGNOSIS — M2141 Flat foot [pes planus] (acquired), right foot: Secondary | ICD-10-CM

## 2019-03-13 DIAGNOSIS — M2142 Flat foot [pes planus] (acquired), left foot: Secondary | ICD-10-CM

## 2019-03-14 ENCOUNTER — Encounter: Payer: Self-pay | Admitting: Podiatry

## 2019-03-14 NOTE — Progress Notes (Signed)
Subjective:  Patient ID: Kirsten Cooper, female    DOB: 09/20/1980,  MRN: [id number]  Chief Complaint  Patient presents with  . Foot Pain    pt is here for a f/u of foot pain to the right, pt states that the right foot is doing a lot better since the last time she was here    38 y.o. female presents with the above complaint.  Patient is here for a follow-up from a right third interspace neuroma.  Patient states that she is doing much better.  She does not have any pain in the third interspace anymore.  She felt instant relief when taking the injection.  She states that sometimes she has pain however her pain is barely noticeable.  She denies any other acute complaints.  She is interested in obtaining orthotics.  Review of Systems: Negative except as noted in the HPI. Denies N/V/F/Ch.  No past medical history on file.  Current Outpatient Medications:  .  Ascorbic Acid (VITAMIN C PO), Take by mouth daily., Disp: , Rfl:  .  loratadine (CLARITIN) 10 MG tablet, Take 10 mg by mouth as needed for allergies., Disp: , Rfl:  .  Multiple Vitamin (MULTIVITAMIN) tablet, Take 1 tablet by mouth daily., Disp: , Rfl:  .  NIACIN PO, Take by mouth daily., Disp: , Rfl:  .  VITAMIN D PO, Take by mouth daily., Disp: , Rfl:   Social History   Tobacco Use  Smoking Status Never Smoker  Smokeless Tobacco Never Used    No Known Allergies Objective:   There were no vitals filed for this visit. There is no height or weight on file to calculate BMI. Constitutional Well developed. Well nourished.  Vascular Dorsalis pedis pulses palpable bilaterally. Posterior tibial pulses palpable bilaterally. Capillary refill normal to all digits.  No cyanosis or clubbing noted. Pedal hair growth normal.  Neurologic Normal speech. Oriented to person, place, and time. Epicritic sensation to light touch grossly present bilaterally. No Mulder's click noted in the right third interspace with tingling to the third and  the fourth digit.  Mild pain on palpation to the right third interspace.  Dermatologic  nails well groomed without any onychomycosis. No skin lesions noted.  Orthopedic: Normal joint ROM without pain or crepitus bilaterally. No visible deformities. No bony tenderness. Upon gait examination weightbearing, calcaneal eversion noted bilaterally with too many toe signs.  Unable to recreate the arch with dorsiflexion of the hallux.   Radiographs: None  Assessment:   1. Foot pain, right   2. Morton's neuroma, right   3. Pes planus of both feet    Plan:  Patient was evaluated and treated and all questions answered.  Right third interspace Morton's neuroma -Given that her pain has completely resolved from just the nerve/diagnostic block, I will hold off on performing any series of injection such as 4% sclerosing alcohol injection.  If her pain returns, I have instructed her to come and see me right away at that time I will start performing 4% sclerosing alcohol injection for peripheral nerve destruction.  Patient agrees with this plan.  Pes planus rigid -I explained to the patient the etiology of pes planus deformity and various treatment options associated with it including custom-made orthotics to help support the arches of the foot as well as control the hindfoot motion. -Patient will be scheduled to see Kirsten Cooper to have custom-made orthotics. -Patient will benefit from metatarsal pad as she had signs and symptoms of neuroma. No follow-ups  on file.

## 2019-03-25 ENCOUNTER — Other Ambulatory Visit: Payer: Self-pay

## 2019-03-25 ENCOUNTER — Ambulatory Visit (INDEPENDENT_AMBULATORY_CARE_PROVIDER_SITE_OTHER): Payer: Federal, State, Local not specified - PPO | Admitting: Orthotics

## 2019-03-25 DIAGNOSIS — M2141 Flat foot [pes planus] (acquired), right foot: Secondary | ICD-10-CM

## 2019-03-25 DIAGNOSIS — G5761 Lesion of plantar nerve, right lower limb: Secondary | ICD-10-CM | POA: Diagnosis not present

## 2019-03-25 DIAGNOSIS — M2142 Flat foot [pes planus] (acquired), left foot: Secondary | ICD-10-CM | POA: Diagnosis not present

## 2019-03-25 DIAGNOSIS — M79671 Pain in right foot: Secondary | ICD-10-CM

## 2019-03-25 NOTE — Progress Notes (Signed)
Patient presents today to be cast for CFO to address Morton's neuroma    .  Goal is provide CFO with good arch support and rear foot stability to reduce ground reaction forces upon the forefoot.  Also it is desirable to offload pressure on the area between the interspace that is causing discomfort.   A full length FO will be provide with neuroma pad proximal to area of concern.  Forefoot cushioning will also be provided.   

## 2019-04-15 ENCOUNTER — Other Ambulatory Visit: Payer: Federal, State, Local not specified - PPO | Admitting: Orthotics

## 2019-05-01 ENCOUNTER — Other Ambulatory Visit: Payer: Self-pay | Admitting: Podiatry

## 2019-05-01 DIAGNOSIS — G5761 Lesion of plantar nerve, right lower limb: Secondary | ICD-10-CM

## 2019-07-11 ENCOUNTER — Emergency Department: Payer: Federal, State, Local not specified - PPO

## 2019-07-11 ENCOUNTER — Other Ambulatory Visit: Payer: Self-pay

## 2019-07-11 ENCOUNTER — Emergency Department
Admission: EM | Admit: 2019-07-11 | Discharge: 2019-07-11 | Disposition: A | Discharge status: Home / Self Care | Payer: Federal, State, Local not specified - PPO | Attending: Emergency Medicine | Admitting: Emergency Medicine

## 2019-07-11 DIAGNOSIS — Z79899 Other long term (current) drug therapy: Secondary | ICD-10-CM | POA: Insufficient documentation

## 2019-07-11 DIAGNOSIS — R479 Unspecified speech disturbances: Secondary | ICD-10-CM | POA: Diagnosis not present

## 2019-07-11 DIAGNOSIS — R55 Syncope and collapse: Secondary | ICD-10-CM | POA: Insufficient documentation

## 2019-07-11 DIAGNOSIS — R29818 Other symptoms and signs involving the nervous system: Secondary | ICD-10-CM | POA: Diagnosis not present

## 2019-07-11 DIAGNOSIS — R4182 Altered mental status, unspecified: Secondary | ICD-10-CM | POA: Diagnosis not present

## 2019-07-11 DIAGNOSIS — R531 Weakness: Secondary | ICD-10-CM | POA: Insufficient documentation

## 2019-07-11 LAB — TROPONIN I (HIGH SENSITIVITY): Troponin I (High Sensitivity): <2 ng/L (ref <18)

## 2019-07-11 LAB — CBC
HCT: 41.7 % (ref 36.0–46.0)
Hemoglobin: 13.6 g/dL (ref 12.0–15.0)
MCH: 28.8 pg (ref 26.0–34.0)
MCHC: 32.6 g/dL (ref 30.0–36.0)
MCV: 88.2 fL (ref 80.0–100.0)
Platelets: 280 10*3/uL (ref 150–400)
RBC: 4.73 MIL/uL (ref 3.87–5.11)
RDW: 13.1 % (ref 11.5–15.5)
WBC: 10.8 10*3/uL — ABNORMAL HIGH (ref 4.0–10.5)
nRBC: 0 % (ref 0.0–0.2)

## 2019-07-11 LAB — PROTIME-INR
INR: 1 (ref 0.8–1.2)
Prothrombin Time: 12.7 seconds (ref 11.4–15.2)

## 2019-07-11 LAB — BASIC METABOLIC PANEL
Anion gap: 6 (ref 5–15)
BUN: 14 mg/dL (ref 6–20)
CO2: 25 mmol/L (ref 22–32)
Calcium: 8.9 mg/dL (ref 8.9–10.3)
Chloride: 106 mmol/L (ref 98–111)
Creatinine, Ser: 0.97 mg/dL (ref 0.44–1.00)
GFR calc Af Amer: >60 mL/min (ref >60)
GFR calc non Af Amer: >60 mL/min (ref >60)
Glucose, Bld: 118 mg/dL — ABNORMAL HIGH (ref 70–99)
Potassium: 4 mmol/L (ref 3.5–5.1)
Sodium: 137 mmol/L (ref 135–145)

## 2019-07-11 LAB — APTT: aPTT: 30 seconds (ref 24–36)

## 2019-07-11 LAB — POCT PREGNANCY, URINE: Preg Test, Ur: NEGATIVE

## 2019-07-11 LAB — GLUCOSE, CAPILLARY: Glucose-Capillary: 106 mg/dL — ABNORMAL HIGH (ref 70–99)

## 2019-07-11 MED ORDER — SODIUM CHLORIDE 0.9 % IV BOLUS
500.0000 mL | Freq: Once | INTRAVENOUS | Status: AC
  Administered 2019-07-11: 500 mL via INTRAVENOUS

## 2019-07-11 MED ORDER — ACETAMINOPHEN 500 MG PO TABS
1000.0000 mg | ORAL_TABLET | Freq: Once | ORAL | Status: AC
  Administered 2019-07-11: 1000 mg via ORAL
  Filled 2019-07-11: qty 2

## 2019-07-11 MED ORDER — MECLIZINE HCL 25 MG PO TABS
25.0000 mg | ORAL_TABLET | Freq: Once | ORAL | Status: AC
  Administered 2019-07-11: 16:00:00 25 mg via ORAL
  Filled 2019-07-11: qty 1

## 2019-07-11 NOTE — ED Notes (Signed)
Pt helped to bathroom with stand by assist. Pt states she feels dizzy at times. Pt able to walk back to bed with stand by assist. EDP notified.

## 2019-07-11 NOTE — ED Notes (Signed)
EDP  In room

## 2019-07-11 NOTE — Discharge Instructions (Addendum)
You were seen and evaluated by the neurology team and this was not felt to be a stroke.  Return to the ER to develop worsening symptoms otherwise you follow with your neurologist

## 2019-07-11 NOTE — ED Triage Notes (Signed)
Pt via POV, spouse at bedside who st pt lethargic at 1320 c/o dizziness, speech 'slower", lethargic and able to follow commands.. No hx of strokes. Weak BI grip strength. Sensation intact.  Pt st feeling congested within 3 weeks, dry cough, fatigue. Denies recent illness. Denies fever.  Pt A/Ox4.

## 2019-07-11 NOTE — Progress Notes (Signed)
CODE STROKE- PHARMACY COMMUNICATION   Time CODE STROKE called/page received: 1439  Time response to CODE STROKE was made (in person or via phone): 1445  Time Stroke Kit retrieved from [place] (only if needed): Retrieved 1444 (per last known well reported 1315)  Name of Provider/Nurse contacted: No tpa per Dr Doy Mince  No past medical history on file. Prior to Admission medications   Medication Sig Start Date End Date Taking? Authorizing Provider  Ascorbic Acid (VITAMIN C PO) Take by mouth daily.    [provider]  loratadine (CLARITIN) 10 MG tablet Take 10 mg by mouth as needed for allergies.    [provider]  Multiple Vitamin (MULTIVITAMIN) tablet Take 1 tablet by mouth daily.    [provider]  NIACIN PO Take by mouth daily.    [provider]  VITAMIN D PO Take by mouth daily.    [provider]    Lu Duffel ,PharmD Clinical Pharmacist  07/11/2019  2:52 PM

## 2019-07-11 NOTE — ED Notes (Signed)
EDP shown EKG, notified of pt's symptoms. Dr Cyril Loosen states he will see pt prior to determining if pt code stroke.

## 2019-07-11 NOTE — ED Provider Notes (Signed)
[facility] Emergency Department Provider Note   ____________________________________________    I have reviewed the triage vital signs and the nursing notes.   HISTORY  Chief Complaint Weakness     HPI Kirsten Cooper is a 39 y.o. female who presents with complaints of difficulty speaking.  Apparently the patient had just eaten lunch and was sitting with her sister at the table when she began to attempt to speak to her sister but was unable to she was unable to get the words out.  This is never happened before.  Her husband reports this is happened several times since the initial event which was at approximately 1 PM.  No history of CVA in the past.  No fevers or chills.  Review of records demonstrates the patient has recently seen neurology for evaluation of possible cognitive decline.  No past medical history on file.  Patient Active Problem List   Diagnosis Date Noted  . Exertional headache 04/18/2018  . Memory loss or impairment 01/25/2018    Past Surgical History:  Procedure Laterality Date  . CESAREAN SECTION      Prior to Admission medications   Medication Sig Start Date End Date Taking? Authorizing Provider  Ascorbic Acid (VITAMIN C PO) Take by mouth daily.    [provider]  loratadine (CLARITIN) 10 MG tablet Take 10 mg by mouth as needed for allergies.    [provider]  Multiple Vitamin (MULTIVITAMIN) tablet Take 1 tablet by mouth daily.    [provider]  NIACIN PO Take by mouth daily.    [provider]  VITAMIN D PO Take by mouth daily.    [provider]     Allergies Patient has no known allergies.  No family history on file.  Social History Social History   Tobacco Use  . Smoking status: Never Smoker  . Smokeless tobacco: Never Used  Substance Use Topics  . Alcohol use: Never  . Drug use: Never    Review of Systems  Constitutional: No fever/chills Eyes: No visual  changes.  ENT: No neck pain Cardiovascular: Denies chest pain. Respiratory: Denies shortness of breath. Gastrointestinal: No abdominal pain.   Genitourinary: Negative for dysuria. Musculoskeletal: Negative for back pain. Skin: Negative for rash. Neurological: As above  ____________________________________________   PHYSICAL EXAM:  VITAL SIGNS: ED Triage Vitals  Enc Vitals Group     BP 07/11/19 1410 (!) 128/95     Pulse Rate 07/11/19 1410 78     Resp 07/11/19 1410 17     Temp 07/11/19 1410 98.6 F (37 C)     Temp Source 07/11/19 1410 Oral     SpO2 07/11/19 1410 100 %     Weight 07/11/19 1411 85 kg (187 lb 6.3 oz)     Height 07/11/19 1411 1.575 m (5\' 2" )     Head Circumference --      Peak Flow --      Pain Score --      Pain Loc --      Pain Edu? --      Excl. in [place]? --     Constitutional: Alert and oriented.  Patient complains of feeling weak  Nose: No congestion/rhinnorhea. Mouth/Throat: Mucous membranes are moist.    Cardiovascular: Normal rate, regular rhythm. Grossly normal heart sounds.  Good peripheral circulation. Respiratory: Normal respiratory effort.  No retractions. Lungs CTAB. Gastrointestinal: Soft and nontender. No distention.  No CVA tenderness.  Musculoskeletal: No lower extremity tenderness  nor edema.  Warm and well perfused.  Normal strength in all extremities Neurologic:  Normal speech and language. No gross focal neurologic deficits are appreciated.  Cranial nerves II through XII are normal Skin:  Skin is warm, dry and intact. No rash noted. Psychiatric: Mood and affect are normal. Speech and behavior are normal.  ____________________________________________   LABS (all labs ordered are listed, but only abnormal results are displayed)  Labs Reviewed  CBC - Abnormal; Notable for the following components:      Result Value   WBC 10.8 (*)    All other components within normal limits  GLUCOSE, CAPILLARY - Abnormal; Notable for the following  components:   Glucose-Capillary 106 (*)    All other components within normal limits  BASIC METABOLIC PANEL  APTT  PROTIME-INR  POC URINE PREG, ED  TROPONIN I (HIGH SENSITIVITY)   ____________________________________________  EKG  ED ECG REPORT I, Jene Every, the attending physician, personally viewed and interpreted this ECG.  Date: 07/11/2019  Rhythm: normal sinus rhythm QRS Axis: normal Intervals: normal ST/T Wave abnormalities: normal Narrative Interpretation: no evidence of acute ischemia  ____________________________________________  RADIOLOGY  CT head ____________________________________________   PROCEDURES  Procedure(s) performed: No  Procedures   Critical Care performed: yes  CRITICAL CARE Performed by: Jene Every   Total critical care time: 30 minutes  Critical care time was exclusive of separately billable procedures and treating other patients.  Critical care was necessary to treat or prevent imminent or life-threatening deterioration.  Critical care was time spent personally by me on the following activities: development of treatment plan with patient and/or surrogate as well as nursing, discussions with consultants, evaluation of patient's response to treatment, examination of patient, obtaining history from patient or surrogate, ordering and performing treatments and interventions, ordering and review of laboratory studies, ordering and review of radiographic studies, pulse oximetry and re-evaluation of patient's condition.  ____________________________________________   INITIAL IMPRESSION / ASSESSMENT AND PLAN / ED COURSE  Pertinent labs & imaging results that were available during my care of the patient were reviewed by me and considered in my medical decision making (see chart for details).  Patient presents with abrupt onset of difficulty speaking, described as the inability to speak while sitting with her sister, witnessed by  sister.  Apparently is happened approximately 1:00.  She did recover relatively quickly but this is apparently happened again.  No history of CVA, no other neuro deficits.  Review of medical records demonstrates a history of neurocognitive testing, continuous EEG which was unremarkable however this was performed for other memory issues.  Given abrupt onset will call code stroke per neurology evaluation  Appreciate Dr. Thad Ranger consultation, she does not feel this is a CVA or TIA.  Does not recommend any further imaging.  If no further episodes and labs unremarkable appropriate for discharge home    ____________________________________________   FINAL CLINICAL IMPRESSION(S) / ED DIAGNOSES  Final diagnoses:  Near syncope        Note:  This document was prepared using Dragon voice recognition software and may include unintentional dictation errors.   Jene Every, MD 07/11/19 [phone]

## 2019-07-11 NOTE — ED Notes (Signed)
Per Dr. Thad Ranger cancel code stroke

## 2019-07-11 NOTE — ED Provider Notes (Signed)
3:28 PM Assumed care for off going team.   Blood pressure (!) 142/89, pulse 79, temperature 98.6 F (37 C), temperature source Oral, resp. rate 19, height 5\' 2"  (1.575 m), weight 85 kg, SpO2 100 %.  See their HPI for full report but in brief Aphasia, code stroke called--> plan to dc as long as improving and labs okay.   CT imaging was negative.  Patient felt a little dizzy so given some meclizine, fluids.  On reevaluation patient continues to have normal neuro exam.  No chest pain or sob. She reports just feeling a little sluggish but her speech is normal.  Per Dr. plan patient will be discharged home and can return if she develops worsening symptoms but she has had no recurrent aphasia for greater than 3 hours while being monitored in the ER.  I discussed with patient she feels comfortable going home and following up outpatient.   I discussed the provisional nature of ED diagnosis, the treatment so far, the ongoing plan of care, follow up appointments and return precautions with the patient and any family or support people present. They expressed understanding and agreed with the plan, discharged home.          Thad Ranger, MD 07/11/19 [phone]

## 2019-07-11 NOTE — ED Notes (Signed)
Pt with clear speech, more alert than on arrival, pt states she does not feel as sleepy as when she arrived. Pt able to concentrate when asked questions, answering appropriately. Pt states she feels much better at present.

## 2019-07-11 NOTE — Progress Notes (Signed)
Ch visited with Pt in response to Code Stroke. Pt's husband at bedside. Pt reports of weakness; Pt seems concerned about health. Pt's husband Casimiro Needle said "as long as everything is normal with her like the neurologist said" Pt asked about chaplain's role at this time. Ch explained chaplains being present as a support during this time for Pt and family. Pt requested prayer. Ch prayed with Pt and Pt's husband. They prayed along with Ch. Ch let them know about chaplain availability anytime before leaving.

## 2019-07-11 NOTE — ED Notes (Signed)
Code  Stroke  Called  To [phone] 

## 2019-07-11 NOTE — Consult Note (Signed)
Requesting Physician: Cyril Loosen    Chief Complaint: Difficulty with speech  I have been asked by Dr. Cyril Loosen to see this patient in consultation for stroke.  HPI: Kirsten Cooper is an 39 y.o. female with a history of cognitive complaints and intermittent right sided neurological symptoms who reports that today she was eating with a family member and had acute onset of feeling weak all over and being unable to speak.  By the time she arrived at the ED she was able to speak but continued to feel to the point that at times she was unable to cooperate with testing.  Code stroke called at that time.  Initial NIHSS of 0. Patient followed by Dr. Karel Jarvis on an outpatient basis.  Has had cognitive complaints since her 30's.  Work up for cognitive issues and right sided complaints have been unremarkable and have included MRI of the brain, MRA and EEG.    Date last known well: 07/11/2019 Time last known well: Time: 13:15 tPA Given: No: Resolution of symptoms, not felt to be a stroke  No past medical history on file.  Past Surgical History:  Procedure Laterality Date  . CESAREAN SECTION      No family history on file. Social History:  reports that she has never smoked. She has never used smokeless tobacco. She reports that she does not drink alcohol or use drugs.  Allergies: No Known Allergies  Medications: I have reviewed the patient's current medications. Prior to Admission medications   Medication Sig Start Date End Date Taking? Authorizing Provider  Ascorbic Acid (VITAMIN C PO) Take by mouth daily.    [provider]  loratadine (CLARITIN) 10 MG tablet Take 10 mg by mouth as needed for allergies.    [provider]  Multiple Vitamin (MULTIVITAMIN) tablet Take 1 tablet by mouth daily.    [provider]  NIACIN PO Take by mouth daily.    [provider]  VITAMIN D PO Take by mouth daily.    [provider]    ROS: History obtained from the  patient  General ROS: negative for - chills, fatigue, fever, night sweats, weight gain or weight loss Psychological ROS: negative for - behavioral disorder, hallucinations, memory difficulties, mood swings or suicidal ideation Ophthalmic ROS: negative for - blurry vision, double vision, eye pain or loss of vision ENT ROS: negative for - epistaxis, nasal discharge, oral lesions, sore throat, tinnitus or vertigo Allergy and Immunology ROS: negative for - hives or itchy/watery eyes Hematological and Lymphatic ROS: negative for - bleeding problems, bruising or swollen lymph nodes Endocrine ROS: negative for - galactorrhea, hair pattern changes, polydipsia/polyuria or temperature intolerance Respiratory ROS: negative for - cough, hemoptysis, shortness of breath or wheezing Cardiovascular ROS: negative for - chest pain, dyspnea on exertion, edema or irregular heartbeat Gastrointestinal ROS: negative for - abdominal pain, diarrhea, hematemesis, nausea/vomiting or stool incontinence Genito-Urinary ROS: negative for - dysuria, hematuria, incontinence or urinary frequency/urgency Musculoskeletal ROS: weakness Neurological ROS: as noted in HPI Dermatological ROS: negative for rash and skin lesion changes  Physical Examination: Blood pressure (!) 142/89, pulse 79, temperature 98.6 F (37 C), temperature source Oral, resp. rate 19, height 5\' 2"  (1.575 m), weight 85 kg, SpO2 100 %.  HEENT-  Normocephalic, no lesions, without obvious abnormality.  Normal external eye and conjunctiva.  Normal TM's bilaterally.  Normal auditory canals and external ears. Normal external nose, mucus membranes and septum.  Normal pharynx. Cardiovascular- S1, S2 normal, pulses palpable throughout  Lungs- chest clear, no wheezing, rales, normal symmetric air entry Abdomen- soft, non-tender; bowel sounds normal; no masses,  no organomegaly Extremities- no edema Lymph-no adenopathy palpable Musculoskeletal-no joint tenderness,  deformity or swelling Skin-warm and dry, no hyperpigmentation, vitiligo, or suspicious lesions  Neurological Examination   Mental Status: Alert, oriented, thought content appropriate.  Speech fluent without evidence of aphasia.  Able to follow 3 step commands without difficulty. Cranial Nerves: II: Discs flat bilaterally; Visual fields grossly normal, pupils equal, round, reactive to light and accommodation III,IV, VI: ptosis not present, extra-ocular motions intact bilaterally V,VII: smile symmetric, facial light touch sensation normal bilaterally VIII: hearing normal bilaterally IX,X: gag reflex present XI: bilateral shoulder shrug XII: midline tongue extension Motor: Right : Upper extremity   5/5    Left:     Upper extremity   5/5  Lower extremity   5/5     Lower extremity   5/5 Tone and bulk:normal tone throughout; no atrophy noted Sensory: Pinprick and light touch intact throughout, bilaterally Deep Tendon Reflexes: 2+ and symmetric throughout Plantars: Right: downgoing   Left: downgoing Cerebellar: Normal finger-to-nose, normal rapid alternating movements and normal heel-to-shin testing bilaterally Gait: normal gait and station   Laboratory Studies:  Basic Metabolic Panel: No results for input(s): NA, K, CL, CO2, GLUCOSE, BUN, CREATININE, CALCIUM, MG, PHOS in the last 168 hours.  Liver Function Tests: No results for input(s): AST, ALT, ALKPHOS, BILITOT, PROT, ALBUMIN in the last 168 hours. No results for input(s): LIPASE, AMYLASE in the last 168 hours. No results for input(s): AMMONIA in the last 168 hours.  CBC: Recent Labs  Lab 07/11/19 1449  WBC 10.8*  HGB 13.6  HCT 41.7  MCV 88.2  PLT 280    Cardiac Enzymes: No results for input(s): CKTOTAL, CKMB, CKMBINDEX, TROPONINI in the last 168 hours.  BNP: Invalid input(s): POCBNP  CBG: Recent Labs  Lab 07/11/19 1437  GLUCAP 106*    Microbiology: No results found for this or any previous  visit.  Coagulation Studies: No results for input(s): LABPROT, INR in the last 72 hours.  Urinalysis: No results for input(s): COLORURINE, LABSPEC, PHURINE, GLUCOSEU, HGBUR, BILIRUBINUR, KETONESUR, PROTEINUR, UROBILINOGEN, NITRITE, LEUKOCYTESUR in the last 168 hours.  Invalid input(s): APPERANCEUR  Lipid Panel:    Component Value Date/Time   CHOL 202 (H) 02/05/2018 1240   TRIG 76 02/05/2018 1240   HDL 56 02/05/2018 1240   CHOLHDL 3.6 02/05/2018 1240   LDLCALC 129 (H) 02/05/2018 1240    HgbA1C:  Lab Results  Component Value Date   HGBA1C 5.3 02/13/2019    Urine Drug Screen:  No results found for: LABOPIA, COCAINSCRNUR, LABBENZ, AMPHETMU, THCU, LABBARB  Alcohol Level: No results for input(s): ETH in the last 168 hours.  Other results: EKG: sinus rhythm at 75 bpm.  Imaging: CT HEAD CODE STROKE WO CONTRAST  Result Date: 07/11/2019 CLINICAL DATA:  Code stroke. Mental status changes. Lethargy. Speech disturbance. Dizziness. EXAM: CT HEAD WITHOUT CONTRAST TECHNIQUE: Contiguous axial images were obtained from the base of the skull through the vertex without intravenous contrast. COMPARISON:  MRI 04/30/2018 FINDINGS: Brain: Normal appearance without evidence of atrophy, old or acute infarction, mass lesion, hemorrhage, hydrocephalus or extra-axial collection. Vascular: No abnormal vascular finding. Skull: Normal Sinuses/Orbits: Normal Other: None ASPECTS ([place] Stroke Program Early CT Score) - Ganglionic level infarction (caudate, lentiform nuclei, internal capsule, insula, M1-M3 cortex): 7 - Supraganglionic infarction (M4-M6 cortex): 3 Total score (0-10 with 10 being normal): 10 IMPRESSION: 1. Normal head CT.  2. ASPECTS is 10. 3. These results were called by telephone at the time of interpretation on 07/11/2019 at 2:50 pm to provider Jene Every , who verbally acknowledged these results. Electronically Signed   By: Paulina Fusi M.D.   On: 07/11/2019 14:51    Assessment: 39 y.o. female  with a history of cognitive complaints and intermittent right focal neurological complaints with negative prior work up who presents with complaints of generalized weakness and difficulty with speech.  Symptoms now resolved with only some mild generalized weakness remaining.  Head CT personally reviewed and shows no acute changes.  Do not suspect acute ischemic event as etiology for symptoms.  Code stroke cancelled.    Stroke Risk Factors - none  Plan: 1. If patient continues to improve may be discharged home and continue follow up on an outpatient basis with Dr. Karel Jarvis.  No further imaging indicated at this time.  Case discussed with Dr. Cyril Loosen.     Thana Farr, MD Neurology [phone] 07/11/2019, 3:34 PM

## 2019-07-11 NOTE — ED Notes (Signed)
Dr. Thad Ranger in room. Pt with LKW at 1315. Pt able to transfer to bed by self in CT scan but felt dizzy and had to stop. Pt able to transfer back to bed without assist.

## 2019-07-16 ENCOUNTER — Ambulatory Visit (INDEPENDENT_AMBULATORY_CARE_PROVIDER_SITE_OTHER): Payer: Federal, State, Local not specified - PPO | Admitting: Psychology

## 2019-07-16 ENCOUNTER — Ambulatory Visit: Payer: Federal, State, Local not specified - PPO | Admitting: Psychology

## 2019-07-16 ENCOUNTER — Encounter: Payer: Self-pay | Admitting: Psychology

## 2019-07-16 ENCOUNTER — Other Ambulatory Visit: Payer: Self-pay

## 2019-07-16 DIAGNOSIS — F909 Attention-deficit hyperactivity disorder, unspecified type: Secondary | ICD-10-CM | POA: Insufficient documentation

## 2019-07-16 DIAGNOSIS — R41840 Attention and concentration deficit: Secondary | ICD-10-CM | POA: Diagnosis not present

## 2019-07-16 DIAGNOSIS — R4189 Other symptoms and signs involving cognitive functions and awareness: Secondary | ICD-10-CM

## 2019-07-16 NOTE — Progress Notes (Signed)
   Psychometrician Note   Cognitive testing was administered to Kirsten Cooper by Wallace Keller, B.S. (psychometrist) under the supervision of Dr. Newman Nickels, Ph.D., licensed psychologist on @DATE @. Kirsten Cooper did not appear overtly distressed by the testing session per behavioral observation or responses across self-report questionnaires. Dr. Cliffton Asters, Ph.D. checked in with Kirsten Cooper as needed to manage any distress related to testing procedures (if applicable). Rest breaks were offered.    The battery of tests administered was selected by Dr. Newman Nickels, Ph.D. with consideration to Kirsten Cooper's current level of functioning, the nature of her symptoms, emotional and behavioral responses during interview, level of literacy, observed level of motivation/effort, and the nature of the referral question. This battery was communicated to the psychometrist. Communication between Dr. Newman Nickels, Ph.D. and the psychometrist was ongoing throughout the evaluation and Dr. Newman Nickels, Ph.D. was immediately accessible at all times. Dr. Newman Nickels, Ph.D. provided supervision to the psychometrist on the date of this service to the extent necessary to assure the quality of all services provided.    Kirsten Cooper will return within approximately 1-2 weeks for an interactive feedback session with Dr. Gerre Scull at which time her test performances, clinical impressions, and treatment recommendations will be reviewed in detail. Kirsten Cooper understands she can contact our office should she require our assistance before this time.  A total of 185 minutes of billable time were spent face-to-face with Ms. Shontz by the psychometrist. This includes both test administration and scoring time. Billing for these services is reflected in the clinical report generated by Dr. Cliffton Asters, Ph.D..  This note reflects time spent with the psychometrician and does not include test scores or any clinical  interpretations made by Dr. Newman Nickels. The full report will follow in a separate note.

## 2019-07-16 NOTE — Progress Notes (Addendum)
NEUROPSYCHOLOGICAL EVALUATION [facility] Department of Neurology  Date of Evaluation: Jul 16, 2019  Reason for Referral:   Haydee Jabbour is a 39 y.o. right-handed African-American female referred by Patrcia Dolly, M.D., to characterize her current cognitive functioning and assist with diagnostic clarity and treatment planning in the context of subjective cognitive decline.   Assessment and Plan:   Clinical Impression(s): Ms. Holsworth pattern of performance is suggestive of neuropsychological functioning within normal limits. Relative weaknesses were exhibited across working memory and retrieval aspects of some verbal information, with additional variability across response inhibition and processing speed. Performance across basic attention, other aspects of executive functioning, receptive and expressive language, encoding (i.e., learning) aspects of memory, retrieval aspects of visual memory, and consolidation scores across both verbal and visual memory were within normal limits. Ms. Broers denied difficulties completing instrumental activities of daily living (ADLs) independently.  Ms. Sahlin reported longstanding difficulties with attention/concentration, dating back to adolescence and early childhood. In school settings, she described herself as "hyperactive," someone who hated reading, and was the "class clown." She described continued difficulties in her day-to-day life surrounding trouble maintaining her focus and the presence of recurring attentional lapses. Attention-deficit/hyperactivity disorder (ADHD) is a neurodevelopmental disorder, requiring the established presence of numerous and impairing symptoms prior to the age of 61 in order to a warrant diagnosis. This makes diagnosing ADHD for the first time in adults particularly challenging due to the reliance on self-report. Ms. Madonna responses across childhood ADHD questionnaires scored in the "highly likely  to have ADHD" range across hyperactivity/impulsivity and in the "likely to have ADHD" range across inattention. I feel that it is without question that she, at the very least, has evident traits of ADHD. However, it is unclear if she meets diagnostic criteria requiring "clear evidence" of functional impairment across multiple psychosocial domains at the present time. Symptoms may also be exacerbated by various psychosocial stressors and broken, disrupted sleep patterns.   Across cognitive testing, relative weaknesses in complex attention (i.e., working memory), processing speed, and aspects of memory can be seen in individuals with ADHD. However, it is important to note that the absence of cognitive deficits should not be interpreted as absence of this condition as there is no pattern of performance across cognitive testing that is specific to ADHD. Individuals with ADHD can perform strongly in testing environments, likely due to the highly structured and distraction free setting in which testing commences.  Recommendations: If desired, Ms. Allensworth should discuss medication options with her PCP to address trouble with inattention/hyperactivity. If she does not wish to take medications, behavioral therapy (i.e., provided by a licensed mental health counselor or psychologist) has also been shown to improve related symptoms and develop coping strategies for improving her overall functioning and efficiency.   Should Ms. Toste return to school with the intention of earning an advanced degree, she would benefit from accommodations including the ability to record all lectures, extra time to complete structured examinations, and the ability to take exams in a quiet, distraction free setting.   Ms. Imbert should also discuss sleep aids with her medical team. She reported waking up often throughout the night and generally experienced "broken" sleep. This can prevent her from spending an appropriate amount of time in  deeper, more restorative levels of sleep and exacerbate underlying attentional weaknesses.   Ms. Vetere is encouraged to attend to lifestyle factors for brain health (e.g., regular physical exercise, good nutrition habits, regular participation in cognitively-stimulating activities,  and general stress management techniques), which are likely to have benefits for both emotional adjustment and cognition. Optimal control of vascular risk factors (including safe cardiovascular exercise and adherence to dietary recommendations) is encouraged.  If interested, there are some activities which have therapeutic value and can be useful in keeping her cognitively stimulated. For suggestions, Ms. Errington is encouraged to go to the following website: https://www.[facility].org/get-to-know-barrow/centers-programs/neurorehabilitation-center/neuro-rehab-apps-and-games/ which has options, categorized by level of difficulty. It should be noted that these activities should not be viewed as a substitute for therapy.  For day-to-day problems recalling information, she may benefit from using strategies to aid with her learning and memory, such as asking questions for clarification, requesting that information to be repeated, or repeating an explanation in her own words to ensure comprehension and promote encoding.  Memory can be improved using internal strategies such as rehearsal, repetition, chunking, mnemonics, association, and imagery. External strategies such as written notes in a consistently used memory journal, visual and nonverbal auditory cues such as a calendar on the refrigerator or appointments with alarm, such as on a cell phone, can also help maximize recall.    To address problems with processing speed, she may wish to consider:   -Ensuring that she is alerted when essential material or instructions are being presented   -Adjusting the speed at which new information is presented   -Allowing for more time in  comprehending, processing, and responding in conversation  To address problems with complex attention and executive dysfunction, she may wish to consider:   -Avoiding external distractions when needing to concentrate   -Limiting exposure to fast paced environments with multiple sensory demands   -Writing down complicated information and using checklists   -Attempting and completing one task at a time (i.e., no multi-tasking)   -Verbalizing aloud each step of a task to maintain focus   -Reducing the amount of information considered at one time  Review of Records:   Ms. Mcmeekin was seen at the [facility] ED on 09/09/2018 with sudden onset of shortness of breath and chest tightness and pressure. She had recently returned from [place], [place] after an 8-hour car drive. She had a mild headache which improved after taking ibuprofen. However, she then developed a tight pressure sensation in her lower sternum that made her have the sensation of not being able to get a deep breath. She denied any cough, fever, or sputum production. She also denied abdominal pain, nausea, vomiting, or unilateral leg pain/swelling. Laying back may have made things slightly worse but she was not sure. Her symptoms initially resolved after taking a GI cocktail but when she started coughing and laid down, the symptoms returned. She was ultimately diagnosed with gastroesophageal reflux disease with esophagitis and atypical chest pain, treated with a PPI and Maalox, and was discharged home.   She was seen by [facility] Neurology Patrcia Dolly, M.D.) on 02/15/2019 for an evaluation of memory lapses. She reported first noticing changes in her thinking around 2011/2012 where her thinking was foggy, she could not think clearly throughout the day, and she felt like she could not access information from memory storage. She thought it was stress-related at that time. However, towards her graduation in 2018, symptoms were more  pronounced and she had to pull herself out of conversations. She denied any speech difficulties. She reported always being "hyperactive" and did not like reading in past school settings. Her husband describes instances where they will have watched movies or attended family events that Ms. Bellanger  had no recollection about. However, as she watches the movie an additional time, she will start to remember some of it. Her husband also noted that she stares off sometimes; she denied any gaps in time, olfactory/gustatory hallucinations, or myoclonic jerks. She has noticed occasional twitching on the left lower eyelid. Headaches, dizziness, diplopia, dysarthria/dysphagia, neck/back pain, bowel/bladder dysfunction, anosmia, and tremors were denied. Sleep was said to be improving. She used to sleep 4-5 hours, but with lifestyle changes, she sleeps 7 hours now and feels refreshed with no daytime drowsiness. She described her mood having the potential to be better. Performance on a brief cognitive screening instrument (SLUMS) was 20/30. Ultimately, Ms. Mcleary was referred for a comprehensive neuropsychological evaluation to characterize her cognitive abilities and to assist with diagnostic clarity and treatment planning.   Ms. Kotara was most recently seen by the [facility] ED on 07/11/2019 with complaints of difficulty speaking. She had just eaten lunch and was sitting with her sister at the table. She began to attempt to speak to her sister but was unable to get the words out. This was said to be a novel event. Her husband reported that it happened several times between lunch (approximately 1:00pm) and the time she was seen at the ED (2:41pm). ECG was normal. TIA/CVA was not suspected. Ultimately, she was diagnosed with near syncope and discharged home.   Awake and asleep EEG on 02/25/2019 was normal. Brain MRI on 04/30/2018 was unremarkable. Head CT on 07/11/2019 was also unremarkable.   Past Medical  History:  Diagnosis Date   Exertional headache 04/18/2018   GERD with esophagitis 09/09/2018    Past Surgical History:  Procedure Laterality Date   CESAREAN SECTION      Current Outpatient Medications:    ascorbic acid (VITAMIN C) 500 MG tablet, Take 500 mg by mouth daily., Disp: , Rfl:    loratadine (CLARITIN) 10 MG tablet, Take 10 mg by mouth as needed for allergies., Disp: , Rfl:    Multiple Vitamin (MULTIVITAMIN) tablet, Take 1 tablet by mouth daily., Disp: , Rfl:   Clinical Interview:   Cognitive Symptoms: Decreased short-term memory: Endorsed. She described primary difficulties with the retrieval of information from memory storage. This information was said to come to her with time, but often not at the time she originally was trying to access it. She also described a remote period where difficulties retrieving names of familiar individuals was pronounced. This seems to have improved spontaneously; however, occasional difficulties were noted. Her husband reported instances where she will forget previous statements which were made or locations she has visited, only to be later reminded. Deficits were first said to emerge around 07/01/2007. However, they appeared more pronounced in 06/30/16 as she was preparing to graduate from college with her Bachelor's degree. Decreased long-term memory: Denied. Decreased attention/concentration: Endorsed. Deficits in attention/concentration and maintaining her focus were said to represent her primary concern. These were said to be longstanding in nature, dating back to early academic settings where she exhibited symptoms of hyperactivity, hated reading, and was the "class clown." She additionally described some difficulties with increased distractibility, but noted that these did not feel more pronounced relative to normal human experiences. She additionally described some instances where she will insert an unrelated and unintended word while composing emails or  documents. She was never formally assess or diagnosed with ADHD. She stated that if she had grown up in today's climate with proper instruction and attention given to her weaknesses, she likely  would have been diagnosed with ADHD.  Reduced processing speed: Endorsed. She reported feeling mentally foggy and that it often takes her additional time to process information stated to her. She noted that this represented a change from her younger baseline where she described herself as "sharp, but often distracted and hyperactive."  Difficulties with executive functions: Endorsed. She reported varying difficulties with organization and complex planning, stating that there are times where she gets overwhelmed by "paper clutter overload." She and her husband denied instances of impulsivity, trouble with indecisiveness or using good judgment, or overt personality changes.  Difficulties with emotion regulation: Denied. Difficulties with receptive language: Endorsed. Deficits were generally attributed to weaknesses in processing speed. Her husband also reported instances where she does not seem to fully comprehend what has been said to her.  Difficulties with word finding: Endorsed.  Decreased visuoperceptual ability: Denied.  Difficulties completing ADLs: Denied.  Additional Medical History: History of traumatic brain injury/concussion: Denied. History of stroke: Denied. History of seizure activity: Denied. History of known exposure to toxins: Denied. Symptoms of chronic pain: Denied. Experience of frequent headaches/migraines: Denied. However, she did report somewhat recent instances of recurring sharp pain in her eyes ("cramping") due to an unknown cause.  Frequent instances of dizziness/vertigo: Denied.  Sensory changes: She is nearsighted and wears glasses with positive effect. Other sensory changes/difficulties (e.g., hearing, taste, or smell) were denied.  Balance/coordination difficulties:  Denied. Other motor difficulties: Denied.  Sleep History: Estimated hours obtained each night: 7 hours. Difficulties falling asleep: Endorsed. She reported often laying awake for extended periods of time prior to falling asleep. Her husband noted that she has tried melatonin supplements in the past, but these were largely unsuccessful. Her husband further theorized that difficulties were due to her having an overactive or overstimulated mind, as well as some symptoms of stress/anxiety.  Difficulties staying asleep: Endorsed. She reported waking throughout the night, largely for unknown reasons, making the sleep that she does obtain broken in nature. After awakening, she reported additional difficulties falling back asleep due to stressful/anxiety provoking thoughts.  Feels rested and refreshed upon awakening: "For the most part, yes."   History of snoring: Endorsed "sometimes." History of waking up gasping for air: Denied. Witnessed breath cessation while asleep: Denied.  History of vivid dreaming: Denied. Excessive movement while asleep: Denied. Instances of acting out her dreams: Denied.  Psychiatric/Behavioral Health History: Depression: Ms. Mondesir denied, to her knowledge, ever being formally diagnosed with a mental health condition. She described her current mood as "it can change." Her husband noted that she generally exhibits a very stable mood. However, things have a tendency to escalate quickly (i.e., going from "0 to 10") despite the situation not always warranting that level of a reaction. She also described a loss in confidence, largely due to cognitive complaints surrounding processing speed and information retrieval. She acknowledged passive suicidal ideation while a teenager and being in the foster care system. She denied ever developing a plan or intent to act upon these thoughts. Current suicidal ideation, intent, or plan was also denied.  Anxiety: She acknowledged some acute  anxiety symptoms, largely tied to various psychosocial stressors going on in her life. She denied ongoing treatment for mood-related concerns.  Mania: Denied. Trauma History: Denied. Visual/auditory hallucinations: Denied. Delusional thoughts: Denied.  Tobacco: Denied. Alcohol: She denied current alcohol consumption, as well as a history of problematic alcohol abuse or dependence.  Recreational drugs: Denied. Caffeine: 1 cup of coffee in the morning.   Family History:  Problem Relation Age of Onset   Alzheimer's disease Paternal Grandmother    This information was confirmed by Ms. Pascuzzi.  Academic/Vocational History: Highest level of educational attainment: 16 years. Due to Ms. Surette being moved around so frequently while in the foster care system, she only completed up to the 9th grade of her high school education. However, she later earned her GED. After time had passed, she went back to school and earned a Water quality scientist degree in social work, graduating in December 2018. 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 Relative academic weaknesses were denied.  History of developmental delay: Denied. History of grade repetition: Denied. Enrollment in special education courses: Denied. History of LD/ADHD: Denied. However, as described above, longstanding symptoms of hyperactivity and attention/concentration deficits were noted.   Employment: Currently unemployed. 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   Evaluation Results:   Behavioral Observations: Ms. Gales was accompanied by her husband, arrived to her appointment on time, and was appropriately dressed and groomed. she appeared alert and oriented. Observed gait and station were within normal limits. Gross motor functioning appeared intact upon informal observation and no abnormal movements (e.g., tremors) were noted. Her affect was generally relaxed and positive, but did range  appropriately given the subject being discussed during the clinical interview or the task at hand during testing procedures. Spontaneous speech was fluent and word finding difficulties were not observed during the clinical interview. Thought processes were coherent, organized, and normal in content. Insight into her cognitive difficulties appeared generally appropriate. During testing, sustained attention was appropriate. Task engagement was adequate and she persisted when challenged. Overall, Ms. Turano was cooperative with the clinical interview and subsequent testing procedures.   Adequacy of Effort: The validity of neuropsychological testing is limited by the extent to which the individual being tested may be assumed to have exerted adequate effort during testing. Ms. Bojarski expressed her intention to perform to the best of her abilities and exhibited adequate task engagement and persistence. Scores across stand-alone and embedded performance validity measures were within expectation. As such, the results of the current evaluation are believed to be a valid representation of Ms. Evett's current cognitive functioning.  Test Results: Ms. Stauber was fully oriented at the time of the current evaluation.  Intellectual abilities based upon educational and vocational attainment were estimated to be in the average range. Premorbid abilities were estimated to be within the average range based upon a single-word reading test.   Processing speed was average to above average. Basic attention was average. More complex attention (e.g., working memory) was below average to average. Executive functioning was variable, ranging from the well below average to above average ranges. Relative weaknesses were exhibited across certain aspects of response inhibition and problem solving (i.e., not capitalizing off strong initial elimination scores on 20 Questions).   Assessed receptive language abilities were average. Likewise,  Ms. Fosberg did not exhibit any difficulties comprehending task instructions and answered all questions asked of her appropriately. Assessed expressive language (e.g., verbal fluency and confrontation naming) was average to above average.     Assessed visuospatial/visuoconstructional abilities were average to above average.    Learning (i.e., encoding) of novel verbal and visual information was average. Spontaneous delayed recall (i.e., retrieval) of previously learned information was below average to well above average. Retention rates were 68% across a story learning task, 77-85% across a list learning task, 100% across a shape learning task, and 97% across a  complex figure drawing task. Performance across recognition tasks was appropriate, suggesting evidence for information consolidation.  Fine motor speed and coordination were in the average range bilaterally.    Results of emotional screening instruments suggested that recent symptoms of generalized anxiety were in the minimal range, while symptoms of depression were within normal limits. A screening instrument assessing recent sleep quality suggested the presence of mild sleep dysfunction. Childhood symptoms of ADHD scored in the "highly likely to have ADHD" range across hyperactivity/impulsivity and in the "likely to have ADHD" range across inattention.  Tables of Scores:   Note: This summary of test scores accompanies the interpretive report and should not be considered in isolation without reference to the appropriate sections in the text. Descriptors are based on appropriate normative data and may be adjusted based on clinical judgment. The terms impaired and within normal limits (WNL) are used when a more specific level of functioning cannot be determined.       Effort Testing:   DESCRIPTOR       ACS Word Choice: --- --- Within Expectation  WAIS-IV Reliable Digit Span: --- --- Within Expectation  CVLT-III Forced Choice Recognition:  --- --- Within Expectation  BVMT-R Retention Percentage: --- --- Within Expectation  D-KEFS Color Word Effort Index: --- --- Within Expectation       Orientation:      Raw Score Percentile   NAB Orientation, Form 1 29/29 --- ---       Intellectual Functioning:           Standard Score Percentile   Test of Premorbid Functioning: 100 50 Average       Memory:          Wechsler Memory Scale (WMS-IV):                       Raw Score (Scaled Score) Percentile     Logical Memory I 22/50 (9) 37 Average    Logical Memory II 15/50 (7) 16 Below Average    Logical Memory Recognition 21/30 10-16 Below Average       [place] Verbal Learning Test (CVLT-III), Standard Form: Raw Score (Scaled/Standard Score) Percentile     Total Trials 1-5 52/80 (101) 53 Average    List B 6/16 (11) 63 Average    Short-Delay Free Recall 11/16 (10) 50 Average    Short-Delay Cued Recall 11/16 (9) 37 Average    Long Delay Free Recall 11/16 (10) 50 Average    Long Delay Cued Recall 10/16 (8) 25 Average      Recognition Hits 16/16 (13) 84 Above Average      False Positive Errors 4 (7) 16 Below Average       Brief Visuospatial Memory Test (BVMT-R), Form 1: Raw Score (T Score) Percentile     Total Trials 1-3 24/36 (47) 38 Average    Delayed Recall 9/12 (47) 38 Average    Recognition Discrimination Index 6 >16 Within Normal Limits      Recognition Hits 6/6 >16 Within Normal Limits      False Positive Errors 0 >16 Within Normal Limits        Attention/Executive Function:          Trail Making Test (TMT): Raw Score (T Score) Percentile     Part A 19 secs.,  0 errors (61) 86 Above Average    Part B 64 secs.,  1 error (48) 42 Average         Standard Score Percentile  WAIS-IV Processing Speed Index: 89 23 Below Average        Scaled Score Percentile   WAIS-IV Symbol Search: 8 25 Average  WAIS-IV Coding: 8 25 Average        Standard Score Percentile   WAIS-IV Working Memory Index: 89 23 Below Average         Scaled Score Percentile   WAIS-IV Digit Span: 9 37 Average    Forward 10 50 Average    Backward 9 37 Average    Sequencing 8 25 Average  WAIS-IV Arithmetic: 7 16 Below Average       D-KEFS Color-Word Interference Test: Raw Score (Scaled Score) Percentile     Color Naming 28 secs. (10) 50 Average    Word Reading 22 secs. (10) 50 Average    Inhibition 70 secs. (7) 16 Below Average      Total Errors 5 errors (4) 2 Well Below Average    Inhibition/Switching 76 secs. (8) 25 Average      Total Errors 3 errors (9) 37 Average       D-KEFS Verbal Fluency Test: Raw Score (Scaled Score) Percentile     Letter Total Correct 52 (14) 91 Above Average    Category Total Correct 36 (8) 25 Average    Category Switching Total Correct 16 (12) 75 Above Average    Category Switching Accuracy 15 (13) 84 Above Average      Total Set Loss Errors 0 (13) 84 Above Average      Total Repetition Errors 0 (12) 75 Above Average       D-KEFS 20 Questions Test: Scaled Score Percentile     Total Weighted Achievement Score 8 25 Average    Initial Abstraction Score 18 >99 Exceptionally High       [place] Card Sorting Test [facility]): Raw Score Percentile     Categories (trials) 4 (64) >16 Within Normal Limits    Total Errors 17 25 Average    Perseverative Errors 7 21 Below Average    Non-Perseverative Errors 10 14 Below Average    Failure to Maintain Set 0 --- ---       Language:          Verbal Fluency Test: Raw Score (T Score) Percentile     Phonemic Fluency (FAS) 52 (59) 82 Above Average    Animal Fluency 17 (46) 34 Average        NAB Language Module, Form 1: T Score Percentile     Auditory Comprehension 53 62 Average    Naming 30/31 (52) 58 Average       Visuospatial/Visuoconstruction:      Raw Score Percentile   Clock Drawing: 10/10 --- Within Normal Limits       NAB Spatial Module, Form 1: T Score Percentile     Visual Discrimination 57 75 Above Average    Figure Drawing Copy 57 75 Above  Average    Figure Drawing Immediate Recall 64 92 Well Above Average        Scaled Score Percentile   WAIS-IV Block Design: 9 37 Average  WAIS-IV Matrix Reasoning: 9 37 Average       Sensory-Motor:          [place] Grooved Pegboard Test: Raw Score Percentile     Dominant Hand 74 secs.,  0 drops  27 Average    Non-Dominant Hand 76 secs.,  0 drops  50 Average       Mood and Personality:  Raw Score Percentile   Beck Depression Inventory - II: 6 --- Within Normal Limits  PROMIS Anxiety Questionnaire: 14 --- None to Slight       Additional Questionnaires:      Raw Score Percentile   Adult Self-Report Scale (Current):       Inattention 23 --- Likely to have ADHD    Hyperactive/Impulsive 20 --- Likely to have ADHD  Adult Self-Report Scale (Childhood):       Inattention 23 --- Likely to have ADHD    Hyperactive/Impulsive 30 --- Highly likely to have ADHD       PROMIS Sleep Disturbance Questionnaire: 26 --- Mild   Informed Consent and Coding/Compliance:   Ms. Cliffton AstersWhite was provided with a verbal description of the nature and purpose of the present neuropsychological evaluation. Also reviewed were the foreseeable risks and/or discomforts and benefits of the procedure, limits of confidentiality, and mandatory reporting requirements of this provider. The patient was given the opportunity to ask questions and receive answers about the evaluation. Oral consent to participate was provided by the patient.   This evaluation was conducted by Newman NickelsZachary C. Marchele Decock, Ph.D., licensed clinical neuropsychologist. Ms. Cliffton AstersWhite completed a comprehensive clinical interview with Dr. Milbert CoulterMerz, billed as one unit [phone], and 185 minutes of cognitive testing and scoring, billed as one unit [phone] and five additional units [id number]. Psychometrist Wallace Kellerana Chamberlain, B.S., assisted Dr. Milbert CoulterMerz with test administration and scoring procedures. As a separate and discrete service, Dr. Milbert CoulterMerz spent a total of 180 minutes in interpretation and  report writing billed as one unit [phone] and two units 96133.

## 2019-07-23 ENCOUNTER — Other Ambulatory Visit: Payer: Self-pay

## 2019-07-23 ENCOUNTER — Ambulatory Visit (INDEPENDENT_AMBULATORY_CARE_PROVIDER_SITE_OTHER): Payer: Federal, State, Local not specified - PPO | Admitting: Psychology

## 2019-07-23 DIAGNOSIS — R41840 Attention and concentration deficit: Secondary | ICD-10-CM

## 2019-07-23 NOTE — Progress Notes (Signed)
   Neuropsychology Feedback Session Kirsten Cooper. [facility] Department of Neurology  Reason for Referral:   Kirsten Cooper a 39 y.o. right-handed African-American female referred by Patrcia Dolly, M.D.,to characterize hercurrent cognitive functioning and assist with diagnostic clarity and treatment planning in the context of subjective cognitive decline.  Feedback:   [name] completed a comprehensive neuropsychological evaluation on 07/16/2019. Please refer to that encounter for the full report and recommendations. Briefly, results suggested neuropsychological functioning within normal limits. Relative weaknesses were exhibited across working memory and retrieval aspects of some verbal information, with additional variability across response inhibition and processing speed. [name] reported longstanding difficulties with attention/concentration, dating back to adolescence and early childhood. In school settings, she described herself as "hyperactive," someone who hated reading, and was the "class clown." She described continued difficulties in her day-to-day life surrounding trouble maintaining her focus and the presence of recurring attentional lapses. Attention-deficit/hyperactivity disorder (ADHD) is a neurodevelopmental disorder, requiring the established presence of numerous and impairing symptoms prior to the age of 73 in order to a warrant diagnosis. This makes diagnosing ADHD for the first time in adults particularly challenging due to the reliance on self-report. [name] responses across childhood ADHD questionnaires scored in the "highly likely to have ADHD" range across hyperactivity/impulsivity and in the "likely to have ADHD" range across inattention. I feel that it is without question that she, at the very least, has evident traits of ADHD.  [name] was unaccompanied during the current telephone call. Content of the current session focused on the results of her  neuropsychological evaluation. [name] was given the opportunity to ask questions and her questions were answered. She was encouraged to reach out should additional questions arise. Her report is available to her on MyChart.      30 minutes were spent conducting the current feedback session with [name], billed as one unit [id number].

## 2019-07-30 DIAGNOSIS — Z3169 Encounter for other general counseling and advice on procreation: Secondary | ICD-10-CM | POA: Diagnosis not present

## 2019-08-09 ENCOUNTER — Other Ambulatory Visit: Payer: Self-pay

## 2019-08-09 ENCOUNTER — Other Ambulatory Visit: Payer: Federal, State, Local not specified - PPO | Admitting: Orthotics

## 2019-09-12 ENCOUNTER — Telehealth: Payer: Self-pay

## 2019-09-12 NOTE — Telephone Encounter (Signed)
Patient left voicemail stating she has been trying to reach the office to schedule an appointment for herslef and daughter. Please call patient and schedule appointment please. Thank you

## 2019-09-13 ENCOUNTER — Telehealth: Payer: Self-pay | Admitting: Neurology

## 2019-09-13 ENCOUNTER — Ambulatory Visit: Payer: Federal, State, Local not specified - PPO | Admitting: Neurology

## 2019-09-13 NOTE — Telephone Encounter (Signed)
She can just follow-up with me as needed, the Neurocognitive testing was helpful to diagnose her memory loss, pls have her follow-up with her PCP and if PCP does not have report, we can send to them. Thanks

## 2019-09-13 NOTE — Telephone Encounter (Signed)
Left voicemail for patient to call us back.

## 2019-09-13 NOTE — Telephone Encounter (Signed)
Patient had an appointment scheduled today, but was late. She was trying to decide if she felt like she needed to reschedule or not. She would like a call from a nurse letting her know what she needs to come in or if she needed another appointment at all.

## 2019-09-13 NOTE — Telephone Encounter (Signed)
Pt called informed to just follow-up with Dr Karel Jarvis as needed, the Neurocognitive testing was helpful to diagnose her memory loss, pls have her follow-up with her PCP and if PCP does not have report, we can send to them. Pt verbalized understanding will follow up with her PCP

## 2019-10-15 ENCOUNTER — Encounter: Payer: Self-pay | Admitting: Osteopathic Medicine

## 2019-10-15 ENCOUNTER — Other Ambulatory Visit (HOSPITAL_COMMUNITY)
Admission: RE | Admit: 2019-10-15 | Discharge: 2019-10-15 | Disposition: A | Discharge status: Home / Self Care | Payer: Federal, State, Local not specified - PPO | Source: Ambulatory Visit | Attending: Osteopathic Medicine | Admitting: Osteopathic Medicine

## 2019-10-15 ENCOUNTER — Other Ambulatory Visit: Payer: Self-pay

## 2019-10-15 ENCOUNTER — Ambulatory Visit (INDEPENDENT_AMBULATORY_CARE_PROVIDER_SITE_OTHER): Payer: Federal, State, Local not specified - PPO | Admitting: Osteopathic Medicine

## 2019-10-15 VITALS — BP 119/88 | HR 80 | Temp 98.0°F | Ht 62.0 in | Wt 177.0 lb

## 2019-10-15 DIAGNOSIS — R7309 Other abnormal glucose: Secondary | ICD-10-CM

## 2019-10-15 DIAGNOSIS — Z124 Encounter for screening for malignant neoplasm of cervix: Secondary | ICD-10-CM | POA: Insufficient documentation

## 2019-10-15 DIAGNOSIS — Z0000 Encounter for general adult medical examination without abnormal findings: Secondary | ICD-10-CM | POA: Diagnosis not present

## 2019-10-15 DIAGNOSIS — Z1231 Encounter for screening mammogram for malignant neoplasm of breast: Secondary | ICD-10-CM | POA: Diagnosis not present

## 2019-10-15 NOTE — Patient Instructions (Signed)
General Preventive Care  Most recent routine screening labs: need cholesterol screening and diabetes screening.   Blood pressure goal 130/80 or less.   Tobacco: don't! Alcohol: responsible moderation is ok for most adults - if you have concerns about your alcohol intake, please talk to me!   Exercise: as tolerated to reduce risk of cardiovascular disease and diabetes. Strength training will also prevent osteoporosis.   Mental health: if need for mental health care (medicines, counseling, other), or concerns about moods, please let me know!   Sexual / Reproductive health: if need for STD testing, or if concerns with libido/pain problems, please let me know!  Advanced Directive: Living Will and/or Healthcare Power of Attorney recommended for all adults, regardless of age or health.  Vaccines  Flu vaccine: for almost everyone, every fall.   Shingles vaccine: after age 77.   Pneumonia vaccines: after age 57  Tetanus booster: every 10 years  COVID vaccine: THANKS for getting your vaccine! :) Cancer screenings   Colon cancer screening: for everyone age 79-75.   Breast cancer screening: mammogram at age 87 every other year at least, and annually after age 29.   Cervical cancer screening: Pap every 1 to 5 years depending on age and other risk factors. Can usually stop at age 83 or w/ hysterectomy.   Lung cancer screening: not needed for non-smokers  Infection screenings  . HIV: recommended screening at least once age 42-65 . Gonorrhea/Chlamydia: screening as needed . Hepatitis C: recommended once for everyone age 5-75 . TB: certain at-risk populations Other . Bone Density Test: recommended for women at age 52

## 2019-10-15 NOTE — Progress Notes (Signed)
Kirsten Cooper is a 39 y.o. female who presents to  [facility] at [facility]  today, 10/15/19, seeking care for the following:  . Annual physical w/ Pap      ASSESSMENT & PLAN with other pertinent findings:  The primary encounter diagnosis was Annual physical exam. Diagnoses of Elevated glucose, Cervical cancer screening, and Encounter for screening mammogram for malignant neoplasm of breast were also pertinent to this visit.   No results found for this or any previous visit (from the past 24 hour(s)).    Patient Instructions  General Preventive Care  Most recent routine screening labs: need cholesterol screening and diabetes screening.   Blood pressure goal 130/80 or less.   Tobacco: don't! Alcohol: responsible moderation is ok for most adults - if you have concerns about your alcohol intake, please talk to me!   Exercise: as tolerated to reduce risk of cardiovascular disease and diabetes. Strength training will also prevent osteoporosis.   Mental health: if need for mental health care (medicines, counseling, other), or concerns about moods, please let me know!   Sexual / Reproductive health: if need for STD testing, or if concerns with libido/pain problems, please let me know!  Advanced Directive: Living Will and/or Healthcare Power of Attorney recommended for all adults, regardless of age or health.  Vaccines  Flu vaccine: for almost everyone, every fall.   Shingles vaccine: after age 1.   Pneumonia vaccines: after age 13  Tetanus booster: every 10 years  COVID vaccine: THANKS for getting your vaccine! :) Cancer screenings   Colon cancer screening: for everyone age 30-75.   Breast cancer screening: mammogram at age 80 every other year at least, and annually after age 74.   Cervical cancer screening: Pap every 1 to 5 years depending on age and other risk factors. Can usually stop at age 39 or w/ hysterectomy.   Lung  cancer screening: not needed for non-smokers  Infection screenings  . HIV: recommended screening at least once age 47-65 . Gonorrhea/Chlamydia: screening as needed . Hepatitis C: recommended once for everyone age 56-75 . TB: certain at-risk populations Other . Bone Density Test: recommended for women at age 57   Orders Placed This Encounter  Procedures  . MM 3D SCREEN BREAST BILATERAL  . CBC  . COMPLETE METABOLIC PANEL WITH GFR  . Lipid panel  . Hemoglobin A1c    No orders of the defined types were placed in this encounter.    Constitutional:  . VSS, see nurse notes . General Appearance: alert, well-developed, well-nourished, NAD Eyes: Marland Kitchen Normal lids and conjunctive, non-icteric sclera Ears, Nose, Mouth, Throat: . Normal appearance . MMM, posterior pharynx without erythema/exudate Neck: . No masses, trachea midline . No thyroid enlargement/tenderness/mass appreciated Respiratory: . Normal respiratory effort . Breath sounds normal, no wheeze/rhonchi/rales Cardiovascular: . S1/S2 normal, no murmur/rub/gallop auscultated . No lower extremity edema Gastrointestinal: . Nontender, no masses . No hepatomegaly, no splenomegaly . No hernia appreciated Musculoskeletal:  . Gait normal . No clubbing/cyanosis of digits Neurological: . No cranial nerve deficit on limited exam . Motor and sensation intact and symmetric Psychiatric: . Normal judgment/insight . Normal mood and affect GYN: No lesions/ulcers to external genitalia, normal urethra, normal vaginal mucosa, physiologic discharge, cervix normal without lesions, uterus not enlarged or tender, adnexa no masses and nontender BREAST: No rashes/skin changes, normal fibrous breast tissue, no masses or tenderness, normal nipple without discharge, normal axilla    Follow-up instructions: Return in about 1  year (around 10/14/2020).                                         BP 119/88 (BP  Location: Left Arm, Patient Position: Sitting)   Pulse 80   Temp 98 F (36.7 C)   Ht 5\' 2"  (1.575 m)   Wt 177 lb (80.3 kg)   LMP 09/19/2019   SpO2 100%   BMI 32.37 kg/m   Current Meds  Medication Sig  . ascorbic acid (VITAMIN C) 500 MG tablet Take 500 mg by mouth daily.  11/20/2019 loratadine (CLARITIN) 10 MG tablet Take 10 mg by mouth as needed for allergies.  . Multiple Vitamin (MULTIVITAMIN) tablet Take 1 tablet by mouth daily.    No results found for this or any previous visit (from the past 72 hour(s)).  No results found.     All questions at time of visit were answered - patient instructed to contact office with any additional concerns or updates.  ER/RTC precautions were reviewed with the patient as applicable.   Please note: voice recognition software was used to produce this document, and typos may escape review. Please contact Dr. Marland Kitchen for any needed clarifications.

## 2019-10-16 DIAGNOSIS — R7309 Other abnormal glucose: Secondary | ICD-10-CM | POA: Diagnosis not present

## 2019-10-16 DIAGNOSIS — Z0000 Encounter for general adult medical examination without abnormal findings: Secondary | ICD-10-CM | POA: Diagnosis not present

## 2019-10-17 LAB — CBC
HCT: 42 % (ref 35.0–45.0)
Hemoglobin: 13.8 g/dL (ref 11.7–15.5)
MCH: 28.4 pg (ref 27.0–33.0)
MCHC: 32.9 g/dL (ref 32.0–36.0)
MCV: 86.4 fL (ref 80.0–100.0)
MPV: 10.8 fL (ref 7.5–12.5)
Platelets: 304 10*3/uL (ref 140–400)
RBC: 4.86 10*6/uL (ref 3.80–5.10)
RDW: 12.7 % (ref 11.0–15.0)
WBC: 10.2 10*3/uL (ref 3.8–10.8)

## 2019-10-17 LAB — HEMOGLOBIN A1C
Hgb A1c MFr Bld: 5.4 % of total Hgb (ref <5.7)
Mean Plasma Glucose: 108 (calc)
eAG (mmol/L): 6 (calc)

## 2019-10-17 LAB — COMPLETE METABOLIC PANEL WITH GFR
AG Ratio: 1.2 (calc) (ref 1.0–2.5)
ALT: 12 U/L (ref 6–29)
AST: 15 U/L (ref 10–30)
Albumin: 4 g/dL (ref 3.6–5.1)
Alkaline phosphatase (APISO): 126 U/L — ABNORMAL HIGH (ref 31–125)
BUN: 10 mg/dL (ref 7–25)
CO2: 26 mmol/L (ref 20–32)
Calcium: 9.3 mg/dL (ref 8.6–10.2)
Chloride: 103 mmol/L (ref 98–110)
Creat: 0.86 mg/dL (ref 0.50–1.10)
GFR, Est African American: 97 mL/min/{1.73_m2} (ref >=60)
GFR, Est Non African American: 84 mL/min/{1.73_m2} (ref >=60)
Globulin: 3.3 g/dL (calc) (ref 1.9–3.7)
Glucose, Bld: 87 mg/dL (ref 65–99)
Potassium: 4.6 mmol/L (ref 3.5–5.3)
Sodium: 136 mmol/L (ref 135–146)
Total Bilirubin: 0.4 mg/dL (ref 0.2–1.2)
Total Protein: 7.3 g/dL (ref 6.1–8.1)

## 2019-10-17 LAB — LIPID PANEL
Cholesterol: 207 mg/dL — ABNORMAL HIGH (ref <200)
HDL: 47 mg/dL — ABNORMAL LOW (ref >=50)
LDL Cholesterol (Calc): 145 mg/dL (calc) — ABNORMAL HIGH
Non-HDL Cholesterol (Calc): 160 mg/dL (calc) — ABNORMAL HIGH (ref <130)
Total CHOL/HDL Ratio: 4.4 (calc) (ref <5.0)
Triglycerides: 57 mg/dL (ref <150)

## 2019-10-18 LAB — CYTOLOGY - PAP
Comment: NEGATIVE
Diagnosis: NEGATIVE
High risk HPV: NEGATIVE

## 2019-11-20 ENCOUNTER — Other Ambulatory Visit: Payer: Self-pay

## 2019-11-20 ENCOUNTER — Ambulatory Visit (INDEPENDENT_AMBULATORY_CARE_PROVIDER_SITE_OTHER): Payer: Federal, State, Local not specified - PPO

## 2019-11-20 DIAGNOSIS — Z1231 Encounter for screening mammogram for malignant neoplasm of breast: Secondary | ICD-10-CM

## 2020-04-02 DIAGNOSIS — Z3169 Encounter for other general counseling and advice on procreation: Secondary | ICD-10-CM | POA: Diagnosis not present

## 2020-07-03 DIAGNOSIS — Z3141 Encounter for fertility testing: Secondary | ICD-10-CM | POA: Diagnosis not present

## 2020-07-15 DIAGNOSIS — Z3141 Encounter for fertility testing: Secondary | ICD-10-CM | POA: Diagnosis not present

## 2020-07-21 DIAGNOSIS — Z3141 Encounter for fertility testing: Secondary | ICD-10-CM | POA: Diagnosis not present

## 2020-07-24 DIAGNOSIS — Z3141 Encounter for fertility testing: Secondary | ICD-10-CM | POA: Diagnosis not present

## 2020-07-27 DIAGNOSIS — Z3141 Encounter for fertility testing: Secondary | ICD-10-CM | POA: Diagnosis not present

## 2020-07-28 DIAGNOSIS — Z3141 Encounter for fertility testing: Secondary | ICD-10-CM | POA: Diagnosis not present

## 2020-07-29 DIAGNOSIS — N719 Inflammatory disease of uterus, unspecified: Secondary | ICD-10-CM | POA: Diagnosis not present

## 2020-07-29 DIAGNOSIS — Z3183 Encounter for assisted reproductive fertility procedure cycle: Secondary | ICD-10-CM | POA: Diagnosis not present

## 2020-07-29 DIAGNOSIS — N979 Female infertility, unspecified: Secondary | ICD-10-CM | POA: Diagnosis not present

## 2020-07-29 DIAGNOSIS — Z1379 Encounter for other screening for genetic and chromosomal anomalies: Secondary | ICD-10-CM | POA: Diagnosis not present

## 2020-08-17 DIAGNOSIS — Z3141 Encounter for fertility testing: Secondary | ICD-10-CM | POA: Diagnosis not present

## 2020-09-16 DIAGNOSIS — Z3141 Encounter for fertility testing: Secondary | ICD-10-CM | POA: Diagnosis not present

## 2020-10-07 DIAGNOSIS — Z3141 Encounter for fertility testing: Secondary | ICD-10-CM | POA: Diagnosis not present

## 2020-10-28 DIAGNOSIS — Z3141 Encounter for fertility testing: Secondary | ICD-10-CM | POA: Diagnosis not present

## 2020-11-09 DIAGNOSIS — Z3183 Encounter for assisted reproductive fertility procedure cycle: Secondary | ICD-10-CM | POA: Diagnosis not present

## 2020-11-17 DIAGNOSIS — Z3200 Encounter for pregnancy test, result unknown: Secondary | ICD-10-CM | POA: Diagnosis not present

## 2020-11-24 DIAGNOSIS — Z3169 Encounter for other general counseling and advice on procreation: Secondary | ICD-10-CM | POA: Diagnosis not present

## 2020-12-02 DIAGNOSIS — Z3141 Encounter for fertility testing: Secondary | ICD-10-CM | POA: Diagnosis not present

## 2020-12-02 DIAGNOSIS — Z3142 Aftercare following sterilization reversal: Secondary | ICD-10-CM | POA: Diagnosis not present

## 2020-12-08 DIAGNOSIS — Z3200 Encounter for pregnancy test, result unknown: Secondary | ICD-10-CM | POA: Diagnosis not present

## 2020-12-11 DIAGNOSIS — Z3141 Encounter for fertility testing: Secondary | ICD-10-CM | POA: Diagnosis not present

## 2020-12-14 DIAGNOSIS — Z3141 Encounter for fertility testing: Secondary | ICD-10-CM | POA: Diagnosis not present

## 2020-12-16 DIAGNOSIS — Z3183 Encounter for assisted reproductive fertility procedure cycle: Secondary | ICD-10-CM | POA: Diagnosis not present

## 2020-12-29 DIAGNOSIS — Z3200 Encounter for pregnancy test, result unknown: Secondary | ICD-10-CM | POA: Diagnosis not present

## 2021-01-06 ENCOUNTER — Other Ambulatory Visit: Payer: Self-pay | Admitting: Osteopathic Medicine

## 2021-02-11 ENCOUNTER — Ambulatory Visit (INDEPENDENT_AMBULATORY_CARE_PROVIDER_SITE_OTHER): Payer: Self-pay | Admitting: Medical-Surgical

## 2021-02-11 DIAGNOSIS — Z91199 Patient's noncompliance with other medical treatment and regimen due to unspecified reason: Secondary | ICD-10-CM

## 2021-02-11 NOTE — Progress Notes (Signed)
   Complete physical exam  Patient: Kirsten Cooper   DOB: 01/01/2001   40 y.o. Female  MRN: [id number]  Subjective:    No chief complaint on file.   Kirsten Cooper is a 40 y.o. female who presents today for a complete physical exam. She reports consuming a {diet types:17450} diet. {types:19826} She generally feels {DESC; WELL/FAIRLY WELL/POORLY:18703}. She reports sleeping {DESC; WELL/FAIRLY WELL/POORLY:18703}. She {does/does not:200015} have additional problems to discuss today.    Most recent fall risk assessment:    09/08/2021   10:42 AM  Fall Risk   Falls in the past year? 0  Number falls in past yr: 0  Injury with Fall? 0  Risk for fall due to : No Fall Risks  Follow up Falls evaluation completed     Most recent depression screenings:    09/08/2021   10:42 AM 07/30/2020   10:46 AM  PHQ 2/9 Scores  PHQ - 2 Score 0 0  PHQ- 9 Score 5     {VISON DENTAL STD PSA (Optional):27386}  {History (Optional):23778}  Patient Care Team: Pristine Gladhill, NP as PCP - General (Nurse Practitioner)   Outpatient Medications Prior to Visit  Medication Sig   fluticasone (FLONASE) 50 MCG/ACT nasal spray Place 2 sprays into both nostrils in the morning and at bedtime. After 7 days, reduce to once daily.   norgestimate-ethinyl estradiol (SPRINTEC 28) 0.25-35 MG-MCG tablet Take 1 tablet by mouth daily.   Nystatin POWD Apply liberally to affected area 2 times per day   spironolactone (ALDACTONE) 100 MG tablet Take 1 tablet (100 mg total) by mouth daily.   No facility-administered medications prior to visit.    ROS        Objective:     There were no vitals taken for this visit. {Vitals History (Optional):23777}  Physical Exam   No results found for any visits on 10/14/21. {Show previous labs (optional):23779}    Assessment & Plan:    Routine Health Maintenance and Physical Exam  Immunization History  Administered Date(s) Administered   DTaP 03/17/1999, 05/13/1999,  07/22/1999, 04/06/2000, 10/21/2003   Hepatitis A 08/17/2007, 08/22/2008   Hepatitis B 01/02/1999, 02/09/1999, 07/22/1999   HiB (PRP-OMP) 03/17/1999, 05/13/1999, 07/22/1999, 04/06/2000   IPV 03/17/1999, 05/13/1999, 01/10/2000, 10/21/2003   Influenza,inj,Quad PF,6+ Mos 11/22/2013   Influenza-Unspecified 02/22/2012   MMR 01/09/2001, 10/21/2003   Meningococcal Polysaccharide 08/22/2011   Pneumococcal Conjugate-13 04/06/2000   Pneumococcal-Unspecified 07/22/1999, 10/05/1999   Tdap 08/22/2011   Varicella 01/10/2000, 08/17/2007    Health Maintenance  Topic Date Due   HIV Screening  Never done   Hepatitis C Screening  Never done   INFLUENZA VACCINE  10/12/2021   PAP-Cervical Cytology Screening  10/14/2021 (Originally 01/01/2020)   PAP SMEAR-Modifier  10/14/2021 (Originally 01/01/2020)   TETANUS/TDAP  10/14/2021 (Originally 08/21/2021)   HPV VACCINES  Discontinued   COVID-19 Vaccine  Discontinued    Discussed health benefits of physical activity, and encouraged her to engage in regular exercise appropriate for her age and condition.  Problem List Items Addressed This Visit   None Visit Diagnoses     Annual physical exam    -  Primary   Cervical cancer screening       Need for Tdap vaccination          No follow-ups on file.     Marvia Troost, NP   

## 2021-02-11 NOTE — Patient Instructions (Signed)
Preventive Care 42-42 Years Old, Female Preventive care refers to lifestyle choices and visits with your health care provider that can promote health and wellness. Preventive care visits are also called wellness exams. What can I expect for my preventive care visit? Counseling Your health care provider may ask you questions about your: Medical history, including: Past medical problems. Family medical history. Pregnancy history. Current health, including: Menstrual cycle. Method of birth control. Emotional well-being. Home life and relationship well-being. Sexual activity and sexual health. Lifestyle, including: Alcohol, nicotine or tobacco, and drug use. Access to firearms. Diet, exercise, and sleep habits. Work and work Statistician. Sunscreen use. Safety issues such as seatbelt and bike helmet use. Physical exam Your health care provider will check your: Height and weight. These may be used to calculate your BMI (body mass index). BMI is a measurement that tells if you are at a healthy weight. Waist circumference. This measures the distance around your waistline. This measurement also tells if you are at a healthy weight and may help predict your risk of certain diseases, such as type 2 diabetes and high blood pressure. Heart rate and blood pressure. Body temperature. Skin for abnormal spots. What immunizations do I need? Vaccines are usually given at various ages, according to a schedule. Your health care provider will recommend vaccines for you based on your age, medical history, and lifestyle or other factors, such as travel or where you work. What tests do I need? Screening Your health care provider may recommend screening tests for certain conditions. This may include: Lipid and cholesterol levels. Diabetes screening. This is done by checking your blood sugar (glucose) after you have not eaten for a while (fasting). Pelvic exam and Pap test. Hepatitis B test. Hepatitis C  test. HIV (human immunodeficiency virus) test. STI (sexually transmitted infection) testing, if you are at risk. Lung cancer screening. Colorectal cancer screening. Mammogram. Talk with your health care provider about when you should start having regular mammograms. This may depend on whether you have a family history of breast cancer. BRCA-related cancer screening. This may be done if you have a family history of breast, ovarian, tubal, or peritoneal cancers. Bone density scan. This is done to screen for osteoporosis. Talk with your health care provider about your test results, treatment options, and if necessary, the need for more tests. Follow these instructions at home: Eating and drinking  Eat a diet that includes fresh fruits and vegetables, whole grains, lean protein, and low-fat dairy products. Take vitamin and mineral supplements as recommended by your health care provider. Do not drink alcohol if: Your health care provider tells you not to drink. You are pregnant, may be pregnant, or are planning to become pregnant. If you drink alcohol: Limit how much you have to 0-1 drink a day. Know how much alcohol is in your drink. In the [place] one drink equals one 12 oz bottle of beer (355 mL), one 5 oz glass of wine (148 mL), or one 1 oz glass of hard liquor (44 mL). Lifestyle Brush your teeth every morning and night with fluoride toothpaste. Floss one time each day. Exercise for at least 30 minutes 5 or more days each week. Do not use any products that contain nicotine or tobacco. These products include cigarettes, chewing tobacco, and vaping devices, such as e-cigarettes. If you need help quitting, ask your health care provider. Do not use drugs. If you are sexually active, practice safe sex. Use a condom or other form of protection to prevent  STIs. If you do not wish to become pregnant, use a form of birth control. If you plan to become pregnant, see your health care provider for a  prepregnancy visit. Take aspirin only as told by your health care provider. Make sure that you understand how much to take and what form to take. Work with your health care provider to find out whether it is safe and beneficial for you to take aspirin daily. Find healthy ways to manage stress, such as: Meditation, yoga, or listening to music. Journaling. Talking to a trusted person. Spending time with friends and family. Minimize exposure to UV radiation to reduce your risk of skin cancer. Safety Always wear your seat belt while driving or riding in a vehicle. Do not drive: If you have been drinking alcohol. Do not ride with someone who has been drinking. When you are tired or distracted. While texting. If you have been using any mind-altering substances or drugs. Wear a helmet and other protective equipment during sports activities. If you have firearms in your house, make sure you follow all gun safety procedures. Seek help if you have been physically or sexually abused. What's next? Visit your health care provider once a year for an annual wellness visit. Ask your health care provider how often you should have your eyes and teeth checked. Stay up to date on all vaccines. This information is not intended to replace advice given to you by your health care provider. Make sure you discuss any questions you have with your health care provider. Document Revised: 08/26/2020 Document Reviewed: 08/26/2020 Elsevier Patient Education  [place].

## 2021-02-17 ENCOUNTER — Encounter: Payer: Self-pay | Admitting: Family Medicine

## 2021-02-17 ENCOUNTER — Telehealth: Payer: Federal, State, Local not specified - PPO | Admitting: Family Medicine

## 2021-02-17 DIAGNOSIS — U071 COVID-19: Secondary | ICD-10-CM | POA: Diagnosis not present

## 2021-02-17 DIAGNOSIS — R051 Acute cough: Secondary | ICD-10-CM

## 2021-02-17 DIAGNOSIS — R519 Headache, unspecified: Secondary | ICD-10-CM | POA: Diagnosis not present

## 2021-02-17 DIAGNOSIS — B349 Viral infection, unspecified: Secondary | ICD-10-CM

## 2021-02-17 MED ORDER — PREDNISONE 20 MG PO TABS: 40.0000 mg | ORAL_TABLET | Freq: Every day | ORAL | 0 refills | Status: DC

## 2021-02-17 NOTE — Progress Notes (Signed)
    Virtual Visit via Telephone Note  I connected with Kirsten Cooper on 02/17/21 at  9:10 AM EST by telephone after an attempt to connect by video.  I verified that I am speaking with the correct person using two identifiers.   I discussed the limitations of evaluation and management by telemedicine and the availability of in person appointments. The patient expressed understanding and agreed to proceed.  Patient location: at home Provider location: in office  Subjective:    CC:   Chief Complaint  Patient presents with   Headache     HPI: 5 days of Headache.  Has been taking some OTC medication.  Yesterday started with a dry cough and chest hurts with cough. Lots of drainage.  No nasal congestion. No fever or temp.  + ST and some right ear pain.  Had dental work done right before her sxs started.  No sick contacts.  No GI sxs.  Neg COVID test 2 days ago.  + fatigue, muscle aches. No redness in her throat.  No wheezing.     Past medical history, Surgical history, Family history not pertinant except as noted below, Social history, Allergies, and medications have been entered into the medical record, reviewed, and corrections made.    Objective:    General: Speaking clearly in complete sentences without any shortness of breath.  Alert and oriented x3.  Normal judgment. No apparent acute distress.    Impression and Recommendations:    Problem List Items Addressed This Visit   None Visit Diagnoses     Acute cough    -  Primary   Nonintractable headache, unspecified chronicity pattern, unspecified headache type       Viral illness       COVID-19          Acute viral illness-did encourage her to test for COVID again if she has another kit we have been noting that sample of testing earlier negative and then tested positive later.  It does not sound consistent with a bacterial infection at this point.  We discussed maybe a trial of prednisone to help with the severe headache and  ear fullness and pain.  If she is not improving or just not better after the weekend then please let us know.  Or if she develops new symptoms or worsening symptoms please let us know sooner rather than later.  Patient actually sent a MyChart later saying that she actually did test positive for COVID.  Recommend symptomatic care and gave information in regards to quarantine.  No orders of the defined types were placed in this encounter.   Meds ordered this encounter  Medications   predniSONE (DELTASONE) 20 MG tablet    Sig: Take 2 tablets (40 mg total) by mouth daily with breakfast.    Dispense:  10 tablet    Refill:  0    I spent 21 minutes on the day of the encounter to include pre-visit record review, face-to-face time with the patient and post visit ordering of test.    I discussed the assessment and treatment plan with the patient. The patient was provided an opportunity to ask questions and all were answered. The patient agreed with the plan and demonstrated an understanding of the instructions.   The patient was advised to call back or seek an in-person evaluation if the symptoms worsen or if the condition fails to improve as anticipated.   Kirsten Lecher, MD

## 2021-02-17 NOTE — Progress Notes (Signed)
    Virtual Visit via Telephone Note  I connected with Kirsten Cooper on 02/17/21 at  9:10 AM EST by a video enabled telemedicine application and verified that I am speaking with the correct person using two identifiers.   I discussed the limitations of evaluation and management by telemedicine and the availability of in person appointments. The patient expressed understanding and agreed to proceed.  Patient location: at home Provider location: in office  Subjective:    CC:   Chief Complaint  Patient presents with   Headache     HPI: 5 days of Headache.  Has been taking some OTC medication.  Yesterday started with a dry cough and chest hurts with cough. Lots of drainage.  No nasal congestion. No fever or temp.  + ST and some right ear pain.  Had dental work done right before her sxs started.  No sick contacts.  No GI sxs.  Neg COVID test 2 days ago.  + fatigue, muscle aches. No redness in her throat.  No wheezing.     Past medical history, Surgical history, Family history not pertinant except as noted below, Social history, Allergies, and medications have been entered into the medical record, reviewed, and corrections made.    Objective:    General: Speaking clearly in complete sentences without any shortness of breath.  Alert and oriented x3.  Normal judgment. No apparent acute distress.    Impression and Recommendations:    Problem List Items Addressed This Visit   None Visit Diagnoses     Acute cough    -  Primary   Nonintractable headache, unspecified chronicity pattern, unspecified headache type       Viral illness       COVID-19          Acute viral illness-did encourage her to test for COVID again if she has another kit we have been noting that sample of testing earlier negative and then tested positive later.  It does not sound consistent with a bacterial infection at this point.  We discussed maybe a trial of prednisone to help with the severe headache and ear  fullness and pain.  If she is not improving or just not better after the weekend then please let us know.  Or if she develops new symptoms or worsening symptoms please let us know sooner rather than later.  Patient actually sent a MyChart later saying that she actually did test positive for COVID.  Recommend symptomatic care and gave information in regards to quarantine.  No orders of the defined types were placed in this encounter.   Meds ordered this encounter  Medications   predniSONE (DELTASONE) 20 MG tablet    Sig: Take 2 tablets (40 mg total) by mouth daily with breakfast.    Dispense:  10 tablet    Refill:  0      I discussed the assessment and treatment plan with the patient. The patient was provided an opportunity to ask questions and all were answered. The patient agreed with the plan and demonstrated an understanding of the instructions.   The patient was advised to call back or seek an in-person evaluation if the symptoms worsen or if the condition fails to improve as anticipated.   [name], MD

## 2021-03-26 ENCOUNTER — Ambulatory Visit: Payer: Federal, State, Local not specified - PPO | Admitting: Family Medicine

## 2021-03-31 DIAGNOSIS — Z3169 Encounter for other general counseling and advice on procreation: Secondary | ICD-10-CM | POA: Diagnosis not present

## 2021-04-01 NOTE — Patient Instructions (Signed)

## 2021-04-01 NOTE — Progress Notes (Signed)
HPI: Kirsten Cooper is a 41 y.o. female who  has a past medical history of Exertional headache (04/18/2018) and GERD with esophagitis (09/09/2018).  she presents to [facility] today, 04/02/21,  for chief complaint of: Annual physical exam  Dentist: UTD Eye exam: 2 years ago, glasses Exercise: none intentional Diet: no restrictions Pap smear: 10/2019 Mammogram: 11/2019, ordered COVID vaccine: one done, no boosters  Concerns: Inattention/memory  Past medical, surgical, social and family history reviewed:  Patient Active Problem List   Diagnosis Date Noted   ADHD (attention deficit hyperactivity disorder) 07/16/2019   Exertional headache 04/18/2018    Past Surgical History:  Procedure Laterality Date   CESAREAN SECTION      Social History   Tobacco Use   Smoking status: Never   Smokeless tobacco: Never  Substance Use Topics   Alcohol use: Not Currently    Family History  Problem Relation Age of Onset   Alzheimer's disease Paternal Grandmother      Current medication list and allergy/intolerance information reviewed:    Current Outpatient Medications  Medication Sig Dispense Refill   ascorbic acid (VITAMIN C) 500 MG tablet Take 500 mg by mouth daily.     loratadine (CLARITIN) 10 MG tablet Take 10 mg by mouth as needed for allergies.     Multiple Vitamin (MULTIVITAMIN) tablet Take 1 tablet by mouth daily.     No current facility-administered medications for this visit.    No Known Allergies    Review of Systems: Constitutional:  No  fever, no chills, No recent illness, No unintentional weight changes. No significant fatigue.  HEENT: No  headache, no vision change, no hearing change, No sore throat, No  sinus pressure Cardiac: No  chest pain, No  pressure, No palpitations, No  Orthopnea Respiratory:  No  shortness of breath. No  Cough Gastrointestinal: No  abdominal pain, No  nausea, No  vomiting,  No  blood in stool, No   diarrhea, No  constipation  Musculoskeletal: No new myalgia/arthralgia Skin: No  Rash, No other wounds/concerning lesions Genitourinary: No  incontinence, No  abnormal genital bleeding, No abnormal genital discharge Hem/Onc: No  easy bruising/bleeding, No  abnormal lymph node Endocrine: No cold intolerance,  No heat intolerance. No polyuria/polydipsia/polyphagia  Neurologic: No  weakness, No  dizziness, No  slurred speech/focal weakness/facial droop Psychiatric: No  concerns with depression, No  concerns with anxiety, No sleep problems, No mood problems  Exam:  BP (!) 123/93 (BP Location: Left Arm, Patient Position: Sitting, Cuff Size: Normal)    Pulse 79    Resp 20    Ht 5' 2"  (1.575 m)    Wt 180 lb 12.8 oz (82 kg)    SpO2 99%    BMI 33.07 kg/m  Constitutional: VS see above. General Appearance: alert, well-developed, well-nourished, NAD Eyes: Normal lids and conjunctive, non-icteric sclera Ears, Nose, Mouth, Throat: MMM, Normal external inspection ears/nares/mouth/lips/gums. TM normal bilaterally.  Neck: No masses, trachea midline. No thyroid enlargement. No tenderness/mass appreciated. No lymphadenopathy Respiratory: Normal respiratory effort. no wheeze, no rhonchi, no rales Cardiovascular: S1/S2 normal, no murmur, no rub/gallop auscultated. RRR. No lower extremity edema. Pedal pulse II/IV bilaterally PT. No carotid bruit or JVD. No abdominal aortic bruit. Gastrointestinal: Nontender, no masses. No hepatomegaly, no splenomegaly. No hernia appreciated. Bowel sounds normal. Rectal exam deferred.  Musculoskeletal: Gait normal. No clubbing/cyanosis of digits.  Neurological: Normal balance/coordination. No tremor. No cranial nerve deficit on limited exam. Motor and sensation intact and symmetric.  Cerebellar reflexes intact.  Skin: warm, dry, intact. No rash/ulcer. No concerning nevi or subq nodules on limited exam.   Psychiatric: Normal judgment/insight. Normal mood and affect. Oriented x3.     ASSESSMENT/PLAN:   1. Encounter to establish care Reviewed available information and discussed care concerns with patient.   2. Annual physical exam Checking labs. Wellness information provided with AVS. - Lipid panel - COMPLETE METABOLIC PANEL WITH GFR - CBC with Differential/Platelet  3. Attention deficit hyperactivity disorder (ADHD), combined type Never medicated. Using lifestyle modifications to help with symptoms.   4. Thyroid disorder screen Checking TSH. - TSH  5. Diabetes mellitus screening Checking A1c.  - Hemoglobin A1c  6. Encounter for screening mammogram for malignant neoplasm of breast Mammogram ordered.  - MM 3D SCREEN BREAST BILATERAL; Future  7. Stress 8. Inattention Per patient request, checking cortisol levels to see if her stress over inattention may be causing or caused by elevated cortisol.  - Cortisol   Orders Placed This Encounter  Procedures   MM 3D SCREEN BREAST BILATERAL   TSH   Lipid panel   COMPLETE METABOLIC PANEL WITH GFR   CBC with Differential/Platelet   Hemoglobin A1c   Cortisol    No orders of the defined types were placed in this encounter.   Patient Instructions  Preventive Care 37-30 Years Old, Female Preventive care refers to lifestyle choices and visits with your health care provider that can promote health and wellness. Preventive care visits are also called wellness exams. What can I expect for my preventive care visit? Counseling Your health care provider may ask you questions about your: Medical history, including: Past medical problems. Family medical history. Pregnancy history. Current health, including: Menstrual cycle. Method of birth control. Emotional well-being. Home life and relationship well-being. Sexual activity and sexual health. Lifestyle, including: Alcohol, nicotine or tobacco, and drug use. Access to firearms. Diet, exercise, and sleep habits. Work and work Statistician. Sunscreen  use. Safety issues such as seatbelt and bike helmet use. Physical exam Your health care provider will check your: Height and weight. These may be used to calculate your BMI (body mass index). BMI is a measurement that tells if you are at a healthy weight. Waist circumference. This measures the distance around your waistline. This measurement also tells if you are at a healthy weight and may help predict your risk of certain diseases, such as type 2 diabetes and high blood pressure. Heart rate and blood pressure. Body temperature. Skin for abnormal spots. What immunizations do I need? Vaccines are usually given at various ages, according to a schedule. Your health care provider will recommend vaccines for you based on your age, medical history, and lifestyle or other factors, such as travel or where you work. What tests do I need? Screening Your health care provider may recommend screening tests for certain conditions. This may include: Lipid and cholesterol levels. Diabetes screening. This is done by checking your blood sugar (glucose) after you have not eaten for a while (fasting). Pelvic exam and Pap test. Hepatitis B test. Hepatitis C test. HIV (human immunodeficiency virus) test. STI (sexually transmitted infection) testing, if you are at risk. Lung cancer screening. Colorectal cancer screening. Mammogram. Talk with your health care provider about when you should start having regular mammograms. This may depend on whether you have a family history of breast cancer. BRCA-related cancer screening. This may be done if you have a family history of breast, ovarian, tubal, or peritoneal cancers. Bone density scan. This  is done to screen for osteoporosis. Talk with your health care provider about your test results, treatment options, and if necessary, the need for more tests. Follow these instructions at home: Eating and drinking  Eat a diet that includes fresh fruits and vegetables, whole  grains, lean protein, and low-fat dairy products. Take vitamin and mineral supplements as recommended by your health care provider. Do not drink alcohol if: Your health care provider tells you not to drink. You are pregnant, may be pregnant, or are planning to become pregnant. If you drink alcohol: Limit how much you have to 0-1 drink a day. Know how much alcohol is in your drink. In the [place] one drink equals one 12 oz bottle of beer (355 mL), one 5 oz glass of wine (148 mL), or one 1 oz glass of hard liquor (44 mL). Lifestyle Brush your teeth every morning and night with fluoride toothpaste. Floss one time each day. Exercise for at least 30 minutes 5 or more days each week. Do not use any products that contain nicotine or tobacco. These products include cigarettes, chewing tobacco, and vaping devices, such as e-cigarettes. If you need help quitting, ask your health care provider. Do not use drugs. If you are sexually active, practice safe sex. Use a condom or other form of protection to prevent STIs. If you do not wish to become pregnant, use a form of birth control. If you plan to become pregnant, see your health care provider for a prepregnancy visit. Take aspirin only as told by your health care provider. Make sure that you understand how much to take and what form to take. Work with your health care provider to find out whether it is safe and beneficial for you to take aspirin daily. Find healthy ways to manage stress, such as: Meditation, yoga, or listening to music. Journaling. Talking to a trusted person. Spending time with friends and family. Minimize exposure to UV radiation to reduce your risk of skin cancer. Safety Always wear your seat belt while driving or riding in a vehicle. Do not drive: If you have been drinking alcohol. Do not ride with someone who has been drinking. When you are tired or distracted. While texting. If you have been using any mind-altering substances  or drugs. Wear a helmet and other protective equipment during sports activities. If you have firearms in your house, make sure you follow all gun safety procedures. Seek help if you have been physically or sexually abused. What's next? Visit your health care provider once a year for an annual wellness visit. Ask your health care provider how often you should have your eyes and teeth checked. Stay up to date on all vaccines. This information is not intended to replace advice given to you by your health care provider. Make sure you discuss any questions you have with your health care provider. Document Revised: 08/26/2020 Document Reviewed: 08/26/2020 Elsevier Patient Education  [place].  Follow-up plan: Return in about 1 year (around 04/02/2022) for annual physical exam.  Clearnce Sorrel, DNP, APRN, FNP-BC [facility]

## 2021-04-02 ENCOUNTER — Other Ambulatory Visit: Payer: Self-pay

## 2021-04-02 ENCOUNTER — Encounter: Payer: Self-pay | Admitting: Medical-Surgical

## 2021-04-02 ENCOUNTER — Ambulatory Visit (INDEPENDENT_AMBULATORY_CARE_PROVIDER_SITE_OTHER): Payer: Federal, State, Local not specified - PPO | Admitting: Medical-Surgical

## 2021-04-02 VITALS — BP 123/93 | HR 79 | Resp 20 | Ht 62.0 in | Wt 180.8 lb

## 2021-04-02 DIAGNOSIS — Z0000 Encounter for general adult medical examination without abnormal findings: Secondary | ICD-10-CM | POA: Diagnosis not present

## 2021-04-02 DIAGNOSIS — F902 Attention-deficit hyperactivity disorder, combined type: Secondary | ICD-10-CM | POA: Diagnosis not present

## 2021-04-02 DIAGNOSIS — Z7689 Persons encountering health services in other specified circumstances: Secondary | ICD-10-CM

## 2021-04-02 DIAGNOSIS — F439 Reaction to severe stress, unspecified: Secondary | ICD-10-CM | POA: Diagnosis not present

## 2021-04-02 DIAGNOSIS — Z1231 Encounter for screening mammogram for malignant neoplasm of breast: Secondary | ICD-10-CM

## 2021-04-02 DIAGNOSIS — Z1329 Encounter for screening for other suspected endocrine disorder: Secondary | ICD-10-CM | POA: Diagnosis not present

## 2021-04-02 DIAGNOSIS — R41840 Attention and concentration deficit: Secondary | ICD-10-CM | POA: Diagnosis not present

## 2021-04-02 DIAGNOSIS — Z131 Encounter for screening for diabetes mellitus: Secondary | ICD-10-CM

## 2021-04-03 LAB — LIPID PANEL
Cholesterol: 204 mg/dL — ABNORMAL HIGH (ref <200)
HDL: 54 mg/dL (ref >=50)
LDL Cholesterol (Calc): 134 mg/dL (calc) — ABNORMAL HIGH
Non-HDL Cholesterol (Calc): 150 mg/dL (calc) — ABNORMAL HIGH (ref <130)
Total CHOL/HDL Ratio: 3.8 (calc) (ref <5.0)
Triglycerides: 69 mg/dL (ref <150)

## 2021-04-03 LAB — CBC WITH DIFFERENTIAL/PLATELET
Absolute Monocytes: 778 cells/uL (ref 200–950)
Basophils Absolute: 43 cells/uL (ref 0–200)
Basophils Relative: 0.4 %
Eosinophils Absolute: 130 cells/uL (ref 15–500)
Eosinophils Relative: 1.2 %
HCT: 40.1 % (ref 35.0–45.0)
Hemoglobin: 13.3 g/dL (ref 11.7–15.5)
Lymphs Abs: 2160 cells/uL (ref 850–3900)
MCH: 28.4 pg (ref 27.0–33.0)
MCHC: 33.2 g/dL (ref 32.0–36.0)
MCV: 85.7 fL (ref 80.0–100.0)
MPV: 11.1 fL (ref 7.5–12.5)
Monocytes Relative: 7.2 %
Neutro Abs: 7690 cells/uL (ref 1500–7800)
Neutrophils Relative %: 71.2 %
Platelets: 313 10*3/uL (ref 140–400)
RBC: 4.68 10*6/uL (ref 3.80–5.10)
RDW: 13.4 % (ref 11.0–15.0)
Total Lymphocyte: 20 %
WBC: 10.8 10*3/uL (ref 3.8–10.8)

## 2021-04-03 LAB — HEMOGLOBIN A1C
Hgb A1c MFr Bld: 5.5 % of total Hgb (ref <5.7)
Mean Plasma Glucose: 111 mg/dL
eAG (mmol/L): 6.2 mmol/L

## 2021-04-03 LAB — COMPLETE METABOLIC PANEL WITH GFR
AG Ratio: 1.2 (calc) (ref 1.0–2.5)
ALT: 11 U/L (ref 6–29)
AST: 13 U/L (ref 10–30)
Albumin: 3.9 g/dL (ref 3.6–5.1)
Alkaline phosphatase (APISO): 115 U/L (ref 31–125)
BUN: 12 mg/dL (ref 7–25)
CO2: 24 mmol/L (ref 20–32)
Calcium: 9.4 mg/dL (ref 8.6–10.2)
Chloride: 105 mmol/L (ref 98–110)
Creat: 0.99 mg/dL (ref 0.50–0.99)
Globulin: 3.3 g/dL (calc) (ref 1.9–3.7)
Glucose, Bld: 75 mg/dL (ref 65–99)
Potassium: 4.1 mmol/L (ref 3.5–5.3)
Sodium: 137 mmol/L (ref 135–146)
Total Bilirubin: 0.4 mg/dL (ref 0.2–1.2)
Total Protein: 7.2 g/dL (ref 6.1–8.1)
eGFR: 73 mL/min/{1.73_m2} (ref >=60)

## 2021-04-03 LAB — TSH: TSH: 1.49 mIU/L

## 2021-04-03 LAB — CORTISOL: Cortisol, Plasma: 5 ug/dL

## 2021-04-12 DIAGNOSIS — K080 Exfoliation of teeth due to systemic causes: Secondary | ICD-10-CM | POA: Diagnosis not present

## 2021-04-13 DIAGNOSIS — Z3141 Encounter for fertility testing: Secondary | ICD-10-CM | POA: Diagnosis not present

## 2021-04-15 ENCOUNTER — Ambulatory Visit (INDEPENDENT_AMBULATORY_CARE_PROVIDER_SITE_OTHER): Payer: Federal, State, Local not specified - PPO

## 2021-04-15 ENCOUNTER — Other Ambulatory Visit: Payer: Self-pay

## 2021-04-15 DIAGNOSIS — Z1231 Encounter for screening mammogram for malignant neoplasm of breast: Secondary | ICD-10-CM

## 2021-04-15 IMAGING — CT CT HEAD CODE STROKE
3 series · 15 of 46 positions shown, 18 images · non-contrast
Comparison: MRI 04/30/2018

CLINICAL DATA: Code stroke. Mental status changes. Lethargy. Speech
disturbance. Dizziness.

EXAM:
CT HEAD WITHOUT CONTRAST
TECHNIQUE: Contiguous axial images were obtained from the base of the skull
through the vertex without intravenous contrast.

[Series 3: head wo · axial · 0.42mm/px · z∈[-215,-95]mm · 9 of 29 slices shown, 12 images]
[im 3/29  brain]
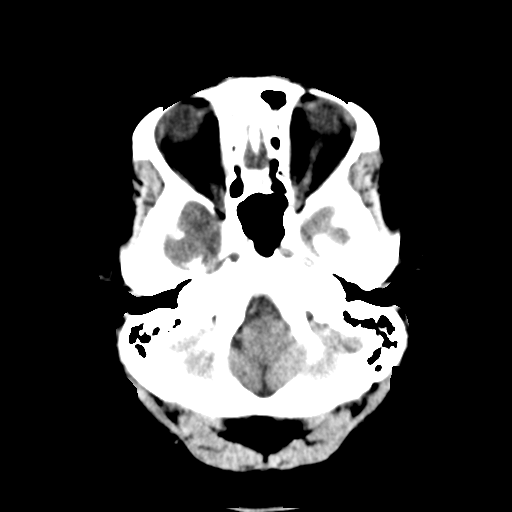
[im 3/29  bone]
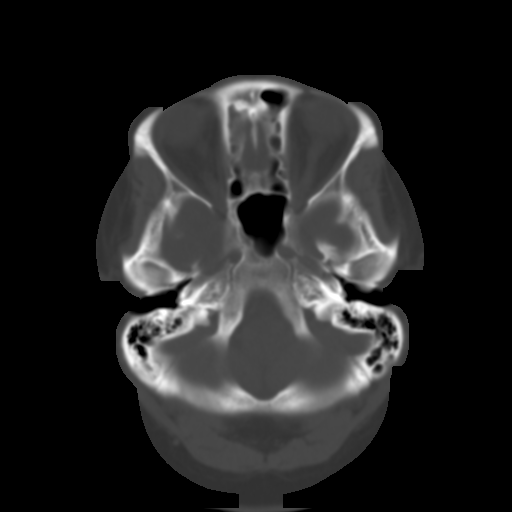
[im 6/29  brain]
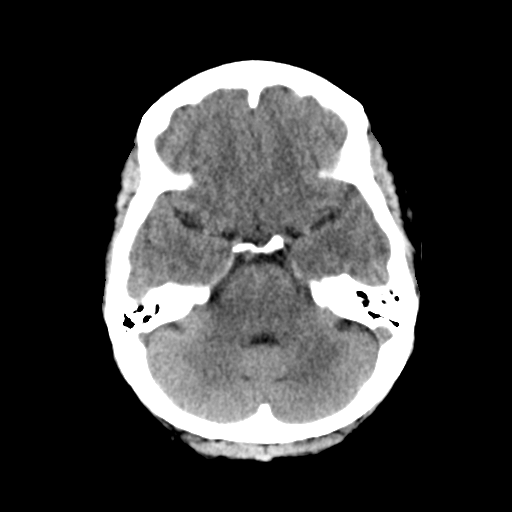
[im 9/29  brain]
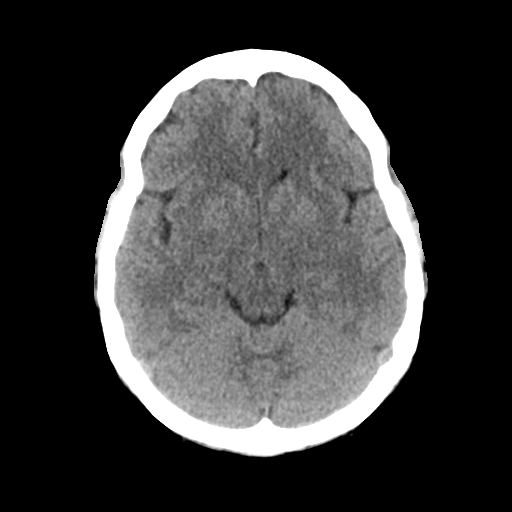
[im 12/29  brain]
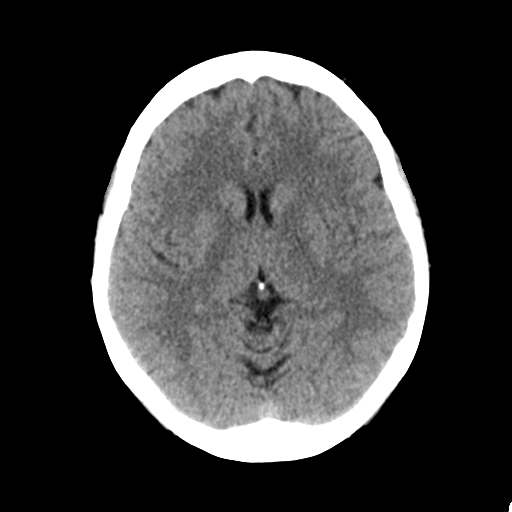
[im 15/29  brain]
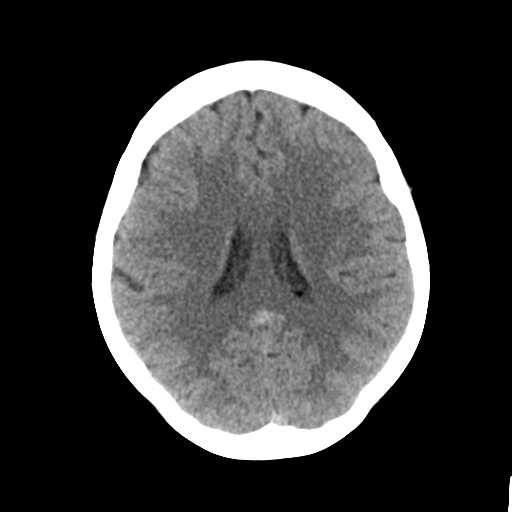
[im 15/29  bone]
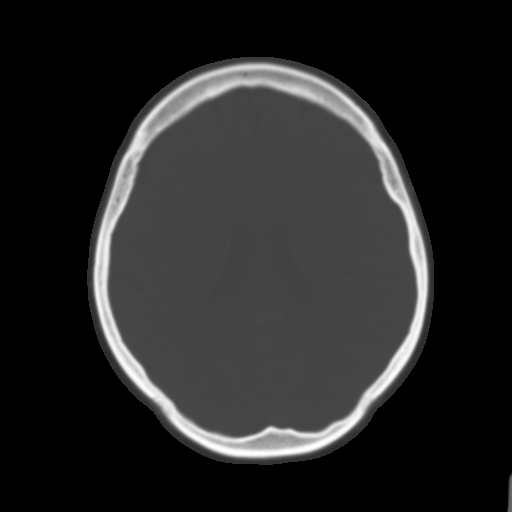
[im 18/29  brain]
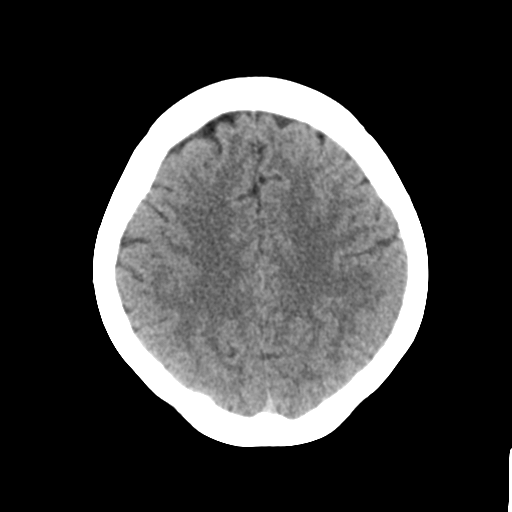
[im 21/29  brain]
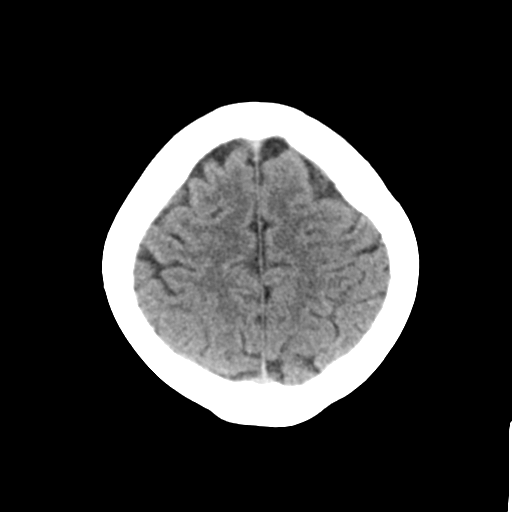
[im 24/29  brain]
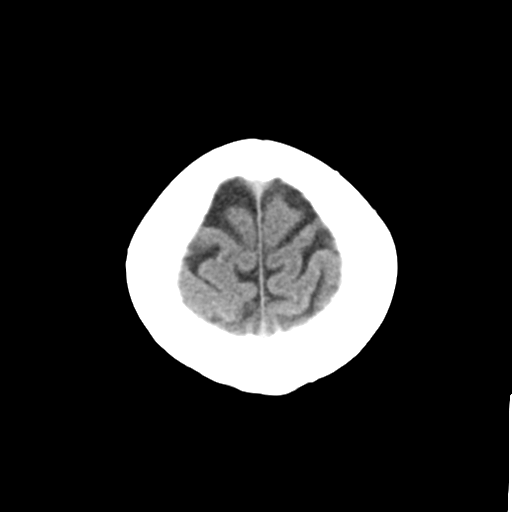
[im 27/29  brain]
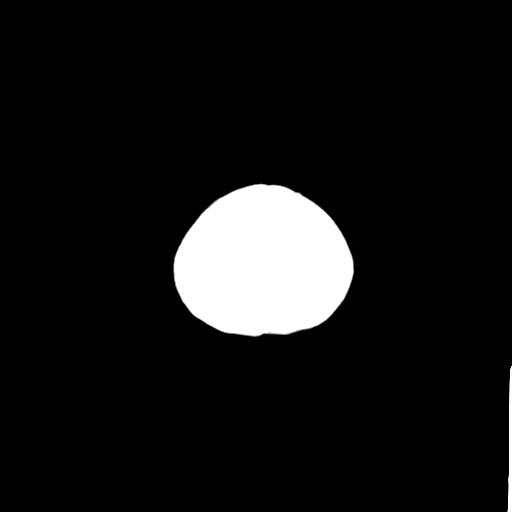
[im 27/29  bone]
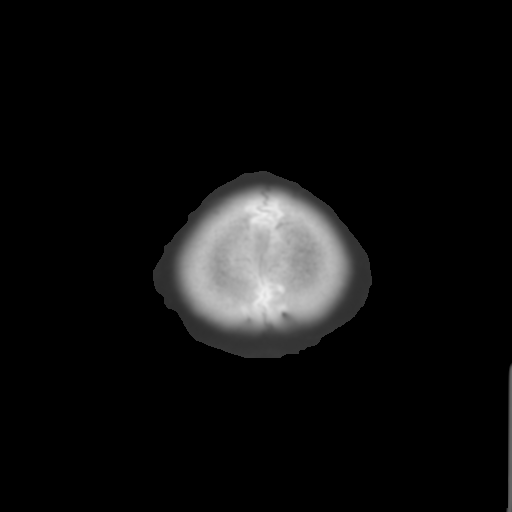

[Series 4: coronal soft tissue · coronal · 0.32mm/px · 3 of 64 slices shown]
[im 22/64  brain]
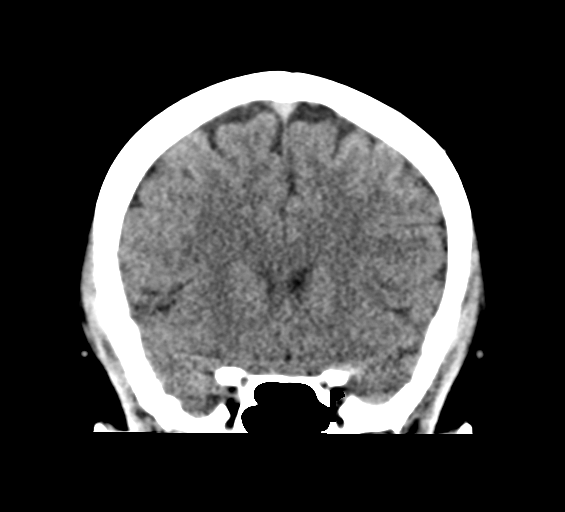
[im 29/64  brain]
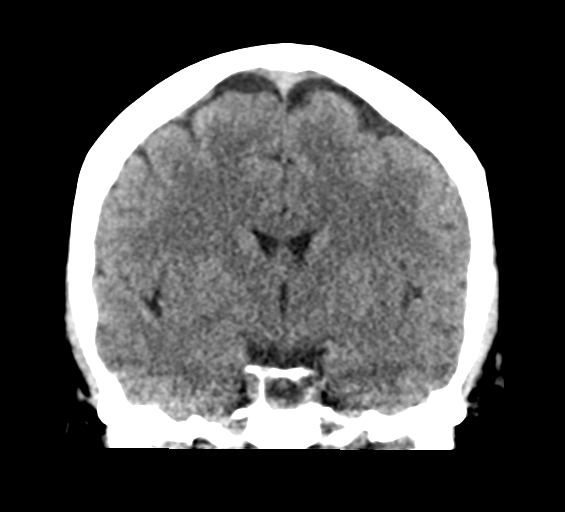
[im 36/64  brain]
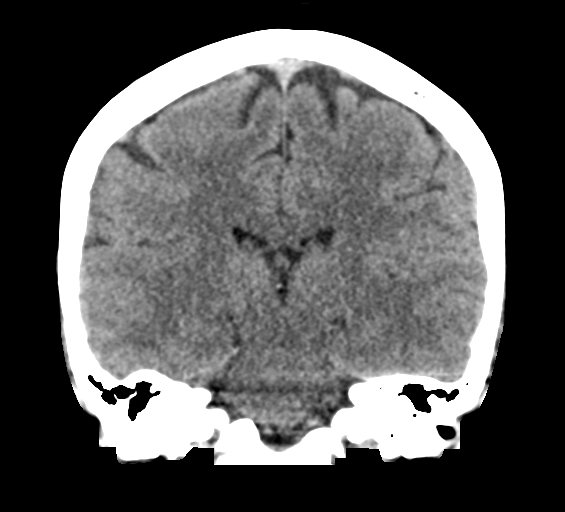

[Series 5: sagittal soft tissue · sagittal · 0.33mm/px · 3 of 51 slices shown]
[im 17/51  brain]
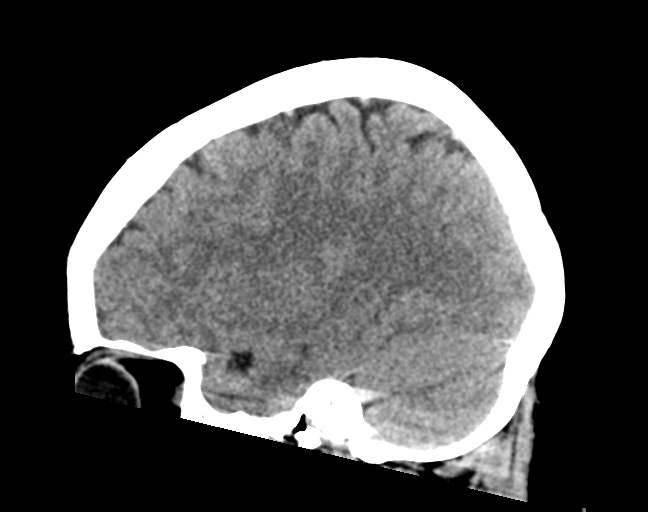
[im 26/51  brain]
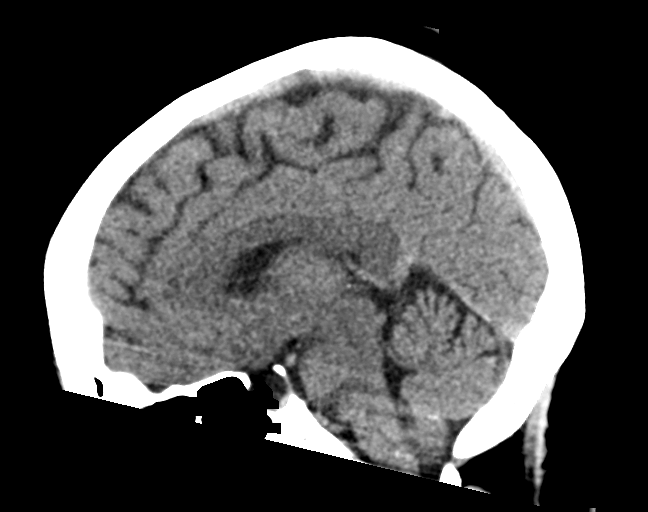
[im 34/51  brain]
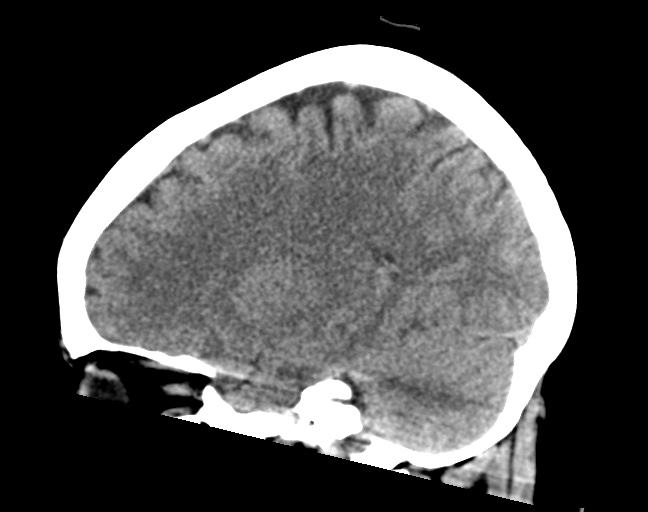

[15 of 46 positions shown; findings below may reference images not displayed]

FINDINGS: Brain: Normal appearance without evidence of atrophy, old or acute
infarction, mass lesion, hemorrhage, hydrocephalus or extra-axial
collection.

Vascular: No abnormal vascular finding.

Skull: Normal

Sinuses/Orbits: Normal

Other: None

ASPECTS (Alberta Stroke Program Early CT Score)

- Ganglionic level infarction (caudate, lentiform nuclei, internal
capsule, insula, M1-M3 cortex): 7

- Supraganglionic infarction (M4-M6 cortex): 3

Total score (0-10 with 10 being normal): 10
IMPRESSION: 1. Normal head CT.
2. ASPECTS is 10.
3. These results were called by telephone at the time of
interpretation on 07/11/2019 at [DATE] to provider AMOGE FACHE ,
who verbally acknowledged these results.

## 2021-04-16 ENCOUNTER — Other Ambulatory Visit: Payer: Self-pay | Admitting: Medical-Surgical

## 2021-04-16 DIAGNOSIS — R928 Other abnormal and inconclusive findings on diagnostic imaging of breast: Secondary | ICD-10-CM

## 2021-04-19 DIAGNOSIS — Z3141 Encounter for fertility testing: Secondary | ICD-10-CM | POA: Diagnosis not present

## 2021-04-21 DIAGNOSIS — Z3141 Encounter for fertility testing: Secondary | ICD-10-CM | POA: Diagnosis not present

## 2021-04-22 DIAGNOSIS — Z3141 Encounter for fertility testing: Secondary | ICD-10-CM | POA: Diagnosis not present

## 2021-04-30 DIAGNOSIS — Z319 Encounter for procreative management, unspecified: Secondary | ICD-10-CM | POA: Diagnosis not present

## 2021-05-04 ENCOUNTER — Ambulatory Visit
Admission: RE | Admit: 2021-05-04 | Discharge: 2021-05-04 | Disposition: A | Discharge status: Home / Self Care | Payer: Federal, State, Local not specified - PPO | Source: Ambulatory Visit | Attending: Medical-Surgical | Admitting: Medical-Surgical

## 2021-05-04 DIAGNOSIS — N6001 Solitary cyst of right breast: Secondary | ICD-10-CM | POA: Diagnosis not present

## 2021-05-04 DIAGNOSIS — R928 Other abnormal and inconclusive findings on diagnostic imaging of breast: Secondary | ICD-10-CM

## 2021-06-22 ENCOUNTER — Encounter: Payer: Self-pay | Admitting: Family Medicine

## 2021-06-22 ENCOUNTER — Ambulatory Visit (INDEPENDENT_AMBULATORY_CARE_PROVIDER_SITE_OTHER): Payer: Federal, State, Local not specified - PPO | Admitting: Family Medicine

## 2021-06-22 ENCOUNTER — Ambulatory Visit (INDEPENDENT_AMBULATORY_CARE_PROVIDER_SITE_OTHER): Payer: Federal, State, Local not specified - PPO

## 2021-06-22 ENCOUNTER — Telehealth (INDEPENDENT_AMBULATORY_CARE_PROVIDER_SITE_OTHER): Payer: Federal, State, Local not specified - PPO

## 2021-06-22 ENCOUNTER — Other Ambulatory Visit: Payer: Self-pay | Admitting: Family Medicine

## 2021-06-22 VITALS — BP 135/80 | HR 79 | Wt 186.0 lb

## 2021-06-22 DIAGNOSIS — O200 Threatened abortion: Secondary | ICD-10-CM

## 2021-06-22 DIAGNOSIS — Z3A01 Less than 8 weeks gestation of pregnancy: Secondary | ICD-10-CM

## 2021-06-22 DIAGNOSIS — O0289 Other abnormal products of conception: Secondary | ICD-10-CM | POA: Insufficient documentation

## 2021-06-22 MED ORDER — OXYCODONE HCL 5 MG PO TABS: 5.0000 mg | ORAL_TABLET | ORAL | 0 refills | Status: DC | PRN

## 2021-06-22 MED ORDER — ONDANSETRON HCL 4 MG PO TABS: 4.0000 mg | ORAL_TABLET | Freq: Three times a day (TID) | ORAL | 0 refills | Status: DC | PRN

## 2021-06-22 NOTE — Progress Notes (Addendum)
New OB Intake ? ?I connected with  Gerre Scull on 06/22/21 at  8:15 AM EDT by MyChart Video Visit and verified that I am speaking with the correct person using two identifiers. Nurse is located at [facility] and pt is located at home. ? ?I discussed the limitations, risks, security and privacy concerns of performing an evaluation and management service by telephone and the availability of in person appointments. I also discussed with the patient that there may be a patient responsible charge related to this service. The patient expressed understanding and agreed to proceed. ? ?I explained I am completing New OB Intake today.   ? ?Patient was seen by [facility]- [facility] for a year. Patient stated on 05/10/2021 they drew her beta level and confirmed she's pregnant. On 06/02/2021 patient had a ultrasound at 7 1/7 weeks from FET shows IUP with fetal pole measuring 6 1/7 weeks with no heartbeat. Returning on 06/07/2021 for another ultrasound, and they confirmed a degenerating fetus and yolk sac was seen without fetal cardiac activity. Patient stated they gave her Cytotec but she would like another opinion. Dr. Crissie Reese agreed to see patient after her viability scan appointment today 06/22/2021 @11 :00.  ? ? , CMA ?06/22/2021  9:03 AM  ?

## 2021-06-22 NOTE — Assessment & Plan Note (Signed)
Reviewed ultrasound images with patient, they show a very irregularly shaped gestational sac with fetal pole measuring similar to prior reported US findings. No fetal cardiac activity noted with color or normal doppler. Discussed with patient that while these findings in isolation are only technically suspicious for failed pregnancy based on SRU criteria, that in conjunction with her reported gestational age based on IVF dating and prior US this is consistent with a nonviable pregnancy.  ? ?Reassured patient that there is nothing she did or did not do to cause this. Reviewed most common reason is presumed to be genetic abnormalities that allow a pregnancy to start but not continue past an early stage, but realistically we do not know the cause in most cases. Reviewed options of expectant, medical, or surgical management. After counseling she elected for medical management. Rx already previously sent for misoprostol 800 mcg buccal, in addition I also sent her zofran ODT, and 4 tabs of 5mg  Oxycodone. Reviewed that cramping, bleeding are normal in the first few hours after taking the medication, but should eventually wane. Reviewed warning signs of heavy vaginal bleeding soaking through >1 pad per hour, crescendo abdominal pain, and fever. ?

## 2021-06-22 NOTE — Progress Notes (Signed)
? ?GYNECOLOGY OFFICE VISIT NOTE ? ?History:  ? Kirsten Cooper is a 41 y.o. [phone] here today for [place] results. ? ?From excellent note by [facility]: ?" ?Patient was seen by Reproductive Medicine- [address] for a year. Patient stated on 05/10/2021 they drew her beta level and confirmed she's pregnant. On 06/02/2021 patient had a ultrasound at 7 1/7 weeks from FET shows IUP with fetal pole measuring 6 1/7 weeks with no heartbeat. Returning on 06/07/2021 for another ultrasound, and they confirmed a degenerating fetus and yolk sac was seen without fetal cardiac activity. Patient stated they gave her Cytotec but she would like another opinion. Dr. Crissie Reese agreed to see patient after her viability scan appointment today 06/22/2021 @11 :00.  ?" ? ?Patient confirmed the above history ?No vaginal bleeding ?Has been having intermittent cramping ?Feels like she already knows what the result is going to be ? ?Health Maintenance Due  ?Topic Date Due  ? Hepatitis C Screening  Never done  ? COVID-19 Vaccine (2 - Moderna series) 11/03/2019  ? ? ?Past Medical History:  ?Diagnosis Date  ? Exertional headache 04/18/2018  ? GERD with esophagitis 09/09/2018  ? ? ?Past Surgical History:  ?Procedure Laterality Date  ? CESAREAN SECTION    ? ? ?The following portions of the patient's history were reviewed and updated as appropriate: allergies, current medications, past family history, past medical history, past social history, past surgical history and problem list.  ? ?Health Maintenance:   ?Last pap: ?Lab Results  ?Component Value Date  ? DIAGPAP  10/15/2019  ?  - Negative for intraepithelial lesion or malignancy (NILM)  ? HPVHIGH Negative 10/15/2019  ? ? ? ?Last mammogram:  ?04/15/2021 and follow up 05/04/2021 - BIRADS 2  ? ? ?Review of Systems:  ?Pertinent items noted in HPI and remainder of comprehensive ROS otherwise negative. ? ?Physical Exam:  ?BP 135/80   Pulse 79   Wt 186 lb (84.4 kg)   BMI  34.02 kg/m?  ?CONSTITUTIONAL: Well-developed, well-nourished female in no acute distress.  ?HEENT:  Normocephalic, atraumatic. External right and left ear normal. No scleral icterus.  ?NECK: Normal range of motion, supple, no masses noted on observation ?SKIN: No rash noted. Not diaphoretic. No erythema. No pallor. ?MUSCULOSKELETAL: Normal range of motion. No edema noted. ?NEUROLOGIC: Alert and oriented to person, place, and time. Normal muscle tone coordination.  ?PSYCHIATRIC: Normal mood and affect. Normal behavior. Normal judgment and thought content. ?RESPIRATORY: Effort normal, no problems with respiration noted ? ?Labs and Imaging ?No results found for this or any previous visit (from the past 168 hour(s)). ?No results found.    ?Assessment and Plan:  ? ?Problem List Items Addressed This Visit   ? ?  ? Other  ? Non-viable pregnancy - Primary  ?  Reviewed ultrasound images with patient, they show a very irregularly shaped gestational sac with fetal pole measuring similar to prior reported 05/06/2021 findings. No fetal cardiac activity noted with color or normal doppler. Discussed with patient that while these findings in isolation are only technically suspicious for failed pregnancy based on SRU criteria, that in conjunction with her reported gestational age based on IVF dating and prior US this is consistent with a nonviable pregnancy.  ? ?Reassured patient that there is nothing she did or did not do to cause this. Reviewed most common reason is presumed to be genetic abnormalities that allow a pregnancy to start but not continue past an early stage, but realistically we  do not know the cause in most cases. Reviewed options of expectant, medical, or surgical management. After counseling she elected for medical management. Rx already previously sent for misoprostol 800 mcg buccal, in addition I also sent her zofran ODT, and 4 tabs of 5mg  Oxycodone. Reviewed that cramping, bleeding are normal in the first few hours  after taking the medication, but should eventually wane. Reviewed warning signs of heavy vaginal bleeding soaking through >1 pad per hour, crescendo abdominal pain, and fever. ?  ?  ? Relevant Medications  ? ondansetron (ZOFRAN) 4 MG tablet  ? oxyCODONE (ROXICODONE) 5 MG immediate release tablet  ? ? ?Routine preventative health maintenance measures emphasized. ?Please refer to After Visit Summary for other counseling recommendations.  ? ?Return for as needed.   ? ?Total face-to-face time with patient: 20 minutes.  Over 50% of encounter was spent on counseling and coordination of care. ? ? ? , MD/MPH ?Attending Family Medicine Physician, Faculty Practice ?[facility], [facility] ? ?

## 2021-06-23 ENCOUNTER — Encounter: Payer: Self-pay | Admitting: Family Medicine

## 2021-06-24 ENCOUNTER — Encounter (HOSPITAL_COMMUNITY): Payer: Self-pay | Admitting: Obstetrics & Gynecology

## 2021-06-24 ENCOUNTER — Other Ambulatory Visit: Payer: Self-pay

## 2021-06-24 ENCOUNTER — Telehealth: Payer: Self-pay

## 2021-06-24 NOTE — Telephone Encounter (Signed)
Called patient no answer, left message with surgery date and time and preop instructions ?

## 2021-06-24 NOTE — Progress Notes (Signed)
PCP - Christen Butter, NP ?Cardiologist - denies ?EKG -  ?Chest x-ray -  ?ECHO -  ?Cardiac Cath -  ? ?COVID TEST- n/a ? ?Anesthesia review: n/a ? ?------------- ? ?SDW INSTRUCTIONS: ? ?Your procedure is scheduled on Friday 4/14. Please report to [facility] Main Entrance "A" at 0730 A.M., and check in at the Admitting office. Call this number if you have problems the morning of surgery: [phone] ? ? ?Remember: Do not eat or drink after midnight the night before your surgery ? ?  ?Medications to take morning of surgery with a sip of water include: ?NONE ? ?As of today, STOP taking any Aspirin (unless otherwise instructed by your surgeon), Aleve, Naproxen, Ibuprofen, Motrin, Advil, Goody's, BC's, all herbal medications, fish oil, and all vitamins. ? ?  ?The Morning of Surgery ?Do not wear jewelry, make-up or nail polish. ?Do not wear lotions, powders, or perfumes/colognes, or deodorant ?Do not bring valuables to the hospital. ?[facility] is not responsible for any belongings or valuables. ? ?If you are a smoker, DO NOT Smoke 24 hours prior to surgery ? ?If you wear a CPAP at night please bring your mask the morning of surgery  ? ?Remember that you must have someone to transport you home after your surgery, and remain with you for 24 hours if you are discharged the same day. ? ?Please bring cases for contacts, glasses, hearing aids, dentures or bridgework because it cannot be worn into surgery.  ? ?Patients discharged the day of surgery will not be allowed to drive home.  ? ?Please shower the NIGHT BEFORE/MORNING OF SURGERY (use antibacterial soap like DIAL soap if possible). Wear comfortable clothes the morning of surgery. Oral Hygiene is also important to reduce your risk of infection.  Remember - BRUSH YOUR TEETH THE MORNING OF SURGERY WITH YOUR REGULAR TOOTHPASTE ? ?Patient denies shortness of breath, fever, cough and chest pain.  ? ? ?   ? ?

## 2021-06-25 ENCOUNTER — Encounter (HOSPITAL_COMMUNITY): Payer: Self-pay | Admitting: Obstetrics & Gynecology

## 2021-06-25 ENCOUNTER — Ambulatory Visit (HOSPITAL_COMMUNITY): Payer: Federal, State, Local not specified - PPO | Admitting: General Practice

## 2021-06-25 ENCOUNTER — Ambulatory Visit (HOSPITAL_COMMUNITY)
Admission: RE | Admit: 2021-06-25 | Discharge: 2021-06-25 | Disposition: A | Discharge status: Home / Self Care | Payer: Federal, State, Local not specified - PPO | Attending: Obstetrics & Gynecology | Admitting: Obstetrics & Gynecology

## 2021-06-25 ENCOUNTER — Encounter (HOSPITAL_COMMUNITY)
Admission: RE | Disposition: A | Discharge status: Home / Self Care | Payer: Self-pay | Source: Home / Self Care | Attending: Obstetrics & Gynecology

## 2021-06-25 DIAGNOSIS — O09521 Supervision of elderly multigravida, first trimester: Secondary | ICD-10-CM | POA: Insufficient documentation

## 2021-06-25 DIAGNOSIS — O0289 Other abnormal products of conception: Secondary | ICD-10-CM

## 2021-06-25 DIAGNOSIS — O021 Missed abortion: Secondary | ICD-10-CM | POA: Diagnosis not present

## 2021-06-25 DIAGNOSIS — Z3A01 Less than 8 weeks gestation of pregnancy: Secondary | ICD-10-CM | POA: Diagnosis not present

## 2021-06-25 HISTORY — PX: DILATION AND EVACUATION: SHX1459

## 2021-06-25 LAB — CBC
HCT: 45.3 % (ref 36.0–46.0)
Hemoglobin: 13.9 g/dL (ref 12.0–15.0)
MCH: 28.1 pg (ref 26.0–34.0)
MCHC: 30.7 g/dL (ref 30.0–36.0)
MCV: 91.7 fL (ref 80.0–100.0)
Platelets: 262 10*3/uL (ref 150–400)
RBC: 4.94 MIL/uL (ref 3.87–5.11)
RDW: 13.8 % (ref 11.5–15.5)
WBC: 9.1 10*3/uL (ref 4.0–10.5)
nRBC: 0 % (ref 0.0–0.2)

## 2021-06-25 LAB — TYPE AND SCREEN
ABO/RH(D): B POS
Antibody Screen: NEGATIVE

## 2021-06-25 LAB — ABO/RH: ABO/RH(D): B POS

## 2021-06-25 SURGERY — DILATION AND EVACUATION, UTERUS
Anesthesia: General

## 2021-06-25 MED ORDER — CHLORHEXIDINE GLUCONATE 0.12 % MT SOLN: 15.0000 mL | Freq: Once | OROMUCOSAL | Status: AC

## 2021-06-25 MED ORDER — DEXAMETHASONE SODIUM PHOSPHATE 10 MG/ML IJ SOLN
INTRAMUSCULAR | Status: DC | PRN
  Administered 2021-06-25: 10 mg via INTRAVENOUS

## 2021-06-25 MED ORDER — ONDANSETRON HCL 4 MG/2ML IJ SOLN
INTRAMUSCULAR | Status: DC | PRN
  Administered 2021-06-25: 4 mg via INTRAVENOUS

## 2021-06-25 MED ORDER — OXYTOCIN 10 UNIT/ML IJ SOLN
INTRAMUSCULAR | Status: AC
  Filled 2021-06-25: qty 2

## 2021-06-25 MED ORDER — LIDOCAINE 2% (20 MG/ML) 5 ML SYRINGE
INTRAMUSCULAR | Status: DC | PRN
  Administered 2021-06-25: 60 mg via INTRAVENOUS

## 2021-06-25 MED ORDER — 0.9 % SODIUM CHLORIDE (POUR BTL) OPTIME
TOPICAL | Status: DC | PRN
  Administered 2021-06-25: 1000 mL

## 2021-06-25 MED ORDER — MEPERIDINE HCL 25 MG/ML IJ SOLN: 6.2500 mg | INTRAMUSCULAR | Status: DC | PRN

## 2021-06-25 MED ORDER — AMISULPRIDE (ANTIEMETIC) 5 MG/2ML IV SOLN: 10.0000 mg | Freq: Once | INTRAVENOUS | Status: DC | PRN

## 2021-06-25 MED ORDER — PROPOFOL 10 MG/ML IV BOLUS
INTRAVENOUS | Status: DC | PRN
  Administered 2021-06-25: 200 mg via INTRAVENOUS

## 2021-06-25 MED ORDER — FENTANYL CITRATE (PF) 250 MCG/5ML IJ SOLN
INTRAMUSCULAR | Status: DC | PRN
  Administered 2021-06-25 (×2): 50 ug via INTRAVENOUS

## 2021-06-25 MED ORDER — DEXMEDETOMIDINE (PRECEDEX) IN NS 20 MCG/5ML (4 MCG/ML) IV SYRINGE
PREFILLED_SYRINGE | INTRAVENOUS | Status: DC | PRN
  Administered 2021-06-25: 4 ug via INTRAVENOUS

## 2021-06-25 MED ORDER — LACTATED RINGERS IV SOLN: INTRAVENOUS | Status: DC

## 2021-06-25 MED ORDER — FENTANYL CITRATE (PF) 250 MCG/5ML IJ SOLN
INTRAMUSCULAR | Status: AC
  Filled 2021-06-25: qty 5

## 2021-06-25 MED ORDER — METHYLERGONOVINE MALEATE 0.2 MG/ML IJ SOLN
INTRAMUSCULAR | Status: AC
  Filled 2021-06-25: qty 1

## 2021-06-25 MED ORDER — METHYLERGONOVINE MALEATE 0.2 MG/ML IJ SOLN
INTRAMUSCULAR | Status: DC | PRN
  Administered 2021-06-25: .2 mg via INTRAMUSCULAR

## 2021-06-25 MED ORDER — OXYCODONE HCL 5 MG/5ML PO SOLN: 5.0000 mg | Freq: Once | ORAL | Status: DC | PRN

## 2021-06-25 MED ORDER — VASOPRESSIN 20 UNITS/100 ML INFUSION FOR SHOCK
INTRAVENOUS | Status: DC | PRN
  Administered 2021-06-25: 20 [IU]/min via INTRAVENOUS

## 2021-06-25 MED ORDER — PROPOFOL 10 MG/ML IV BOLUS
INTRAVENOUS | Status: AC
  Filled 2021-06-25: qty 20

## 2021-06-25 MED ORDER — MIDAZOLAM HCL 2 MG/2ML IJ SOLN
INTRAMUSCULAR | Status: DC | PRN
  Administered 2021-06-25: 2 mg via INTRAVENOUS

## 2021-06-25 MED ORDER — PROMETHAZINE HCL 25 MG/ML IJ SOLN: 6.2500 mg | INTRAMUSCULAR | Status: DC | PRN

## 2021-06-25 MED ORDER — OXYCODONE HCL 5 MG PO TABS: 5.0000 mg | ORAL_TABLET | Freq: Once | ORAL | Status: DC | PRN

## 2021-06-25 MED ORDER — POVIDONE-IODINE 10 % EX SWAB
2.0000 "application " | Freq: Once | CUTANEOUS | Status: AC
  Administered 2021-06-25: 2 via TOPICAL

## 2021-06-25 MED ORDER — CHLORHEXIDINE GLUCONATE 0.12 % MT SOLN
OROMUCOSAL | Status: AC
  Administered 2021-06-25: 15 mL via OROMUCOSAL
  Filled 2021-06-25: qty 15

## 2021-06-25 MED ORDER — DEXTROSE 5 % IV SOLN
200.0000 mg | INTRAVENOUS | Status: AC
  Administered 2021-06-25: 200 mg via INTRAVENOUS
  Filled 2021-06-25: qty 200

## 2021-06-25 MED ORDER — HYDROMORPHONE HCL 1 MG/ML IJ SOLN: 0.2500 mg | INTRAMUSCULAR | Status: DC | PRN

## 2021-06-25 MED ORDER — MIDAZOLAM HCL 2 MG/2ML IJ SOLN
INTRAMUSCULAR | Status: AC
  Filled 2021-06-25: qty 2

## 2021-06-25 MED ORDER — ORAL CARE MOUTH RINSE: 15.0000 mL | Freq: Once | OROMUCOSAL | Status: AC

## 2021-06-25 SURGICAL SUPPLY — 17 items
CATH ROBINSON RED A/P 16FR (CATHETERS) ×2 IMPLANT
FILTER UTR ASPR ASSEMBLY (MISCELLANEOUS) ×2 IMPLANT
GLOVE BIO SURGEON STRL SZ 6.5 (GLOVE) ×2 IMPLANT
GLOVE BIOGEL PI IND STRL 7.0 (GLOVE) ×2 IMPLANT
GLOVE BIOGEL PI INDICATOR 7.0 (GLOVE) ×2
GOWN STRL REUS W/ TWL LRG LVL3 (GOWN DISPOSABLE) ×2 IMPLANT
GOWN STRL REUS W/TWL LRG LVL3 (GOWN DISPOSABLE) ×2
HOSE CONNECTING 18IN BERKELEY (TUBING) ×2 IMPLANT
KIT BERKELEY 1ST TRI 3/8 NO TR (MISCELLANEOUS) ×2 IMPLANT
KIT BERKELEY 1ST TRIMESTER 3/8 (MISCELLANEOUS) ×2 IMPLANT
NS IRRIG 1000ML POUR BTL (IV SOLUTION) ×2 IMPLANT
PACK VAGINAL MINOR WOMEN LF (CUSTOM PROCEDURE TRAY) ×2 IMPLANT
PAD OB MATERNITY 4.3X12.25 (PERSONAL CARE ITEMS) ×2 IMPLANT
SET BERKELEY SUCTION TUBING (SUCTIONS) ×1 IMPLANT
TOWEL GREEN STERILE FF (TOWEL DISPOSABLE) ×4 IMPLANT
UNDERPAD 30X36 HEAVY ABSORB (UNDERPADS AND DIAPERS) ×2 IMPLANT
VACURETTE 9 RIGID CVD (CANNULA) ×1 IMPLANT

## 2021-06-25 NOTE — Transfer of Care (Signed)
Immediate Anesthesia Transfer of Care Note ? ?Patient: Kirsten Cooper ? ?Procedure(s) Performed: DILATATION AND EVACUATION ? ?Patient Location: PACU ? ?Anesthesia Type:General ? ?Level of Consciousness: awake, alert  and oriented ? ?Airway & Oxygen Therapy: Patient Spontanous Breathing ? ?Post-op Assessment: Report given to RN and Post -op Vital signs reviewed and stable ? ?Post vital signs: Reviewed and stable ? ?Last Vitals:  ?Vitals Value Taken Time  ?BP 103/73 06/25/21 1233  ?Temp    ?Pulse 71 06/25/21 1234  ?Resp 15 06/25/21 1234  ?SpO2 100 % 06/25/21 1234  ?Vitals shown include unvalidated device data. ? ?Last Pain:  ?Vitals:  ? 06/25/21 0805  ?TempSrc: Oral  ?   ? ?  ? ?Complications: Pt having bleeding post operative in PACU. Per Dr. Debroah Loop 20 units of pitocin in 1L of LR at 500 ml/hr started and 0.2 mg of methergine IM given.  ?

## 2021-06-25 NOTE — Anesthesia Preprocedure Evaluation (Signed)
Anesthesia Evaluation  ?Patient identified by MRN, date of birth, ID band ?Patient awake ? ? ? ?Reviewed: ?Allergy & Precautions, NPO status , Patient's Chart, lab work & pertinent test results ? ?Airway ?Mallampati: II ? ?TM Distance: >3 FB ?Neck ROM: Full ? ? ? Dental ?no notable dental hx. ? ?  ?Pulmonary ?neg pulmonary ROS,  ?  ?Pulmonary exam normal ?breath sounds clear to auscultation ? ? ? ? ? ? Cardiovascular ?negative cardio ROS ?Normal cardiovascular exam ?Rhythm:Regular Rate:Normal ? ? ?  ?Neuro/Psych ? Headaches, negative psych ROS  ? GI/Hepatic ?negative GI ROS, Neg liver ROS,   ?Endo/Other  ?negative endocrine ROS ? Renal/GU ?negative Renal ROS  ?negative genitourinary ?  ?Musculoskeletal ?negative musculoskeletal ROS ?(+)  ? Abdominal ?(+) + obese,   ?Peds ?negative pediatric ROS ?(+)  Hematology ?negative hematology ROS ?(+)   ?Anesthesia Other Findings ? ? Reproductive/Obstetrics ?negative OB ROS ? ?  ? ? ? ? ? ? ? ? ? ? ? ? ? ?  ?  ? ? ? ? ? ? ? ? ?Anesthesia Physical ?Anesthesia Plan ? ?ASA: 2 ? ?Anesthesia Plan: General  ? ?Post-op Pain Management:   ? ?Induction: Intravenous ? ?PONV Risk Score and Plan: 3 and Ondansetron, Dexamethasone, Midazolam and Treatment may vary due to age or medical condition ? ?Airway Management Planned: LMA ? ?Additional Equipment:  ? ?Intra-op Plan:  ? ?Post-operative Plan: Extubation in OR ? ?Informed Consent: I have reviewed the patients History and Physical, chart, labs and discussed the procedure including the risks, benefits and alternatives for the proposed anesthesia with the patient or authorized representative who has indicated his/her understanding and acceptance.  ? ? ? ?Dental advisory given ? ?Plan Discussed with: CRNA ? ?Anesthesia Plan Comments:   ? ? ? ? ? ? ?Anesthesia Quick Evaluation ? ?

## 2021-06-25 NOTE — Op Note (Signed)
Kirsten Cooper ?PROCEDURE DATE: 06/25/2021 ? ?PREOPERATIVE DIAGNOSIS: 6 week missed abortion ?POSTOPERATIVE DIAGNOSIS: The same ?PROCEDURE:     Dilation and Evacuation ?SURGEON:  Scheryl Darter MD ? ?INDICATIONS: 41 y.o. [id number] with MAB at [redacted] weeks gestation, needing surgical completion.  Risks of surgery were discussed with the patient including but not limited to: bleeding which may require transfusion; infection which may require antibiotics; injury to uterus or surrounding organs; need for additional procedures including laparotomy or laparoscopy; possibility of intrauterine scarring which may impair future fertility; and other postoperative/anesthesia complications. Written informed consent was obtained.   ? ?FINDINGS:  A 7 week size uterus, moderate amounts of products of conception, specimen sent to pathology. ? ?ANESTHESIA:    Monitored intravenous sedation, LMA ?INTRAVENOUS FLUIDS:  700 ml of LR ?ESTIMATED BLOOD LOSS:  Less than 100 ml. ?SPECIMENS:  Products of conception sent to pathology ?COMPLICATIONS:  None immediate. ? ?PROCEDURE DETAILS:  The patient received intravenous Doxycycline while in the preoperative area.  She was then taken to the operating room where monitored intravenous sedation was administered and was found to be adequate.  After an adequate timeout was performed, she was placed in the dorsal lithotomy position and examined; then prepped and draped in the sterile manner.   Her bladder was catheterized for an unmeasured amount of clear, yellow urine. A vaginal speculum was then placed in the patient's vagina and a single tooth tenaculum was applied to the anterior lip of the cervix.  A paracervical block using 30 ml of 0.5% Marcaine was administered. The cervix was gently dilated to accommodate a 9 mm suction curette that was gently advanced to the uterine fundus.  The suction device was then activated and curette slowly rotated to clear the uterus of products of conception.  A sharp  curettage was then performed to confirm complete emptying of the uterus. There was minimal bleeding noted and the tenaculum removed with good hemostasis noted.   All instruments were removed from the patient's vagina.  Sponge and instrument counts were correct times two  The patient tolerated the procedure well and was taken to the recovery area awake, and in stable condition. ? ?Adam Phenix, MD ?06/25/2021 ?12:30 PM ? ? ?

## 2021-06-25 NOTE — Anesthesia Postprocedure Evaluation (Signed)
Anesthesia Post Note ? ?Patient: Lashawne Rowlee ? ?Procedure(s) Performed: DILATATION AND EVACUATION ? ?  ? ?Patient location during evaluation: PACU ?Anesthesia Type: General ?Level of consciousness: awake and alert ?Pain management: pain level controlled ?Vital Signs Assessment: post-procedure vital signs reviewed and stable ?Respiratory status: spontaneous breathing, nonlabored ventilation and respiratory function stable ?Cardiovascular status: blood pressure returned to baseline and stable ?Postop Assessment: no apparent nausea or vomiting ?Anesthetic complications: no ? ? ?No notable events documented. ? ?Last Vitals:  ?Vitals:  ? 06/25/21 1305 06/25/21 1320  ?BP: 116/85 112/84  ?Pulse: 65 63  ?Resp: 17 16  ?Temp:  36.5 ?C  ?SpO2: 100% 100%  ?  ?Last Pain:  ?Vitals:  ? 06/25/21 1320  ?TempSrc:   ?PainSc: 0-No pain  ? ? ?  ?  ?  ?  ?  ?  ? ?Lynda Rainwater ? ? ? ? ?

## 2021-06-25 NOTE — H&P (Signed)
Kirsten Cooper is an 41 y.o. female. [id number] ?Unknown ?Patient with diagnosis of failed pregnancy for D&E.  ?Ultrasound images show a very irregularly shaped gestational sac with fetal pole measuring similar to prior reported Kirsten findings. No fetal cardiac activity noted with color or normal doppler.  ? ? ?Menstrual History: ? ?No LMP recorded. Patient is pregnant. ?  ? ?Past Medical History:  ?Diagnosis Date  ? Exertional headache 04/18/2018  ? GERD with esophagitis 09/09/2018  ? ? ?Past Surgical History:  ?Procedure Laterality Date  ? CESAREAN SECTION    ? ? ?Family History  ?Problem Relation Age of Onset  ? Alzheimer's disease Paternal Grandmother   ? ? ?Social History:  reports that she has never smoked. She has never used smokeless tobacco. She reports that she does not currently use alcohol. She reports that she does not use drugs. ? ?Allergies: No Known Allergies ? ?Medications Prior to Admission  ?Medication Sig Dispense Refill Last Dose  ? ascorbic acid (VITAMIN C) 500 MG tablet Take 500 mg by mouth 2 (two) times a week.   06/25/2021  ? Multiple Vitamin (MULTIVITAMIN) tablet Take 1 tablet by mouth 2 (two) times a week.   06/25/2021  ? ondansetron (ZOFRAN) 4 MG tablet Take 1 tablet (4 mg total) by mouth every 8 (eight) hours as needed for nausea or vomiting. (Patient not taking: Reported on 06/24/2021) 20 tablet 0 Not Taking  ? ? ?Review of Systems  ?Constitutional:  Positive for fatigue.  ?Respiratory: Negative.    ?Cardiovascular: Negative.   ?Gastrointestinal: Negative.   ?Genitourinary:  Negative for pelvic pain, vaginal bleeding and vaginal discharge.  ? ?Blood pressure 115/82, pulse 86, temperature 97.8 ?F (36.6 ?C), temperature source Oral, resp. rate 18, height 5\' 2"  (1.575 m), weight 84.4 kg, SpO2 100 %. ?Physical Exam ?Vitals and nursing note reviewed.  ?Constitutional:   ?   Appearance: Normal appearance.  ?HENT:  ?   Head: Normocephalic and atraumatic.  ?Eyes:  ?   Pupils: Pupils are equal, round, and  reactive to light.  ?Cardiovascular:  ?   Rate and Rhythm: Normal rate.  ?Pulmonary:  ?   Effort: Pulmonary effort is normal.  ?Abdominal:  ?   General: Abdomen is flat.  ?Skin: ?   General: Skin is warm and dry.  ?Neurological:  ?   Mental Status: She is alert.  ?Psychiatric:     ?   Mood and Affect: Mood normal.     ?   Behavior: Behavior normal.  ? ? ?Results for orders placed or performed during the hospital encounter of 06/25/21 (from the past 24 hour(s))  ?Type and screen [facility]     Status: None (Preliminary result)  ? Collection Time: 06/25/21  9:05 AM  ?Result Value Ref Range  ? ABO/RH(D) PENDING   ? Antibody Screen PENDING   ? Sample Expiration    ?  06/28/2021,2359 ?Performed at [facility], [address] [place], [place] ?  ?ABO/Rh     Status: None  ? Collection Time: 06/25/21  9:05 AM  ?Result Value Ref Range  ? ABO/RH(D)    ?  B POS ?Performed at [facility], [address], [place], [place] ?  ?CBC per protocol     Status: None  ? Collection Time: 06/25/21  9:13 AM  ?Result Value Ref Range  ? WBC 9.1 4.0 - 10.5 K/uL  ? RBC 4.94 3.87 - 5.11 MIL/uL  ? Hemoglobin  13.9 12.0 - 15.0 g/dL  ? HCT 45.3 36.0 - 46.0 %  ? MCV 91.7 80.0 - 100.0 fL  ? MCH 28.1 26.0 - 34.0 pg  ? MCHC 30.7 30.0 - 36.0 g/dL  ? RDW 13.8 11.5 - 15.5 %  ? Platelets 262 150 - 400 K/uL  ? nRBC 0.0 0.0 - 0.2 %  ? ?Kirsten images 4/11/ 23 show crl <7 weeks no fhm ? ?Assessment/Plan: ?Missed abortion, failed pregnancy  for surgical completion. ?The risks of surgery were discussed in detail with the patient including but not limited to: bleeding which may require transfusion or reoperation; infection which may require prolonged hospitalization or re-hospitalization and antibiotic therapy; injury to bowel, bladder, ureters and major vessels or other surrounding organs which may lead to other procedures; formation of adhesions; need for additional procedures including laparotomy or subsequent  procedures secondary to intraoperative injury or abnormal pathology; thromboembolic phenomenon; incisional problems and other postoperative or anesthesia complications.  Patient was told that the likelihood that her condition and symptoms will be treated effectively with this surgical management was very high; the postoperative expectations were also discussed in detail. The patient also understands the alternative treatment options which were discussed in full. All questions were answered.   ? ?Scheryl Darter ?06/25/2021, 9:59 AM ? ?

## 2021-06-25 NOTE — Anesthesia Procedure Notes (Signed)
Procedure Name: LMA Insertion ?Date/Time: 06/25/2021 11:43 AM ?Performed by: Alvera Novel, CRNA ?Pre-anesthesia Checklist: Patient identified, Emergency Drugs available, Suction available and Patient being monitored ?Patient Re-evaluated:Patient Re-evaluated prior to induction ?Oxygen Delivery Method: Circle System Utilized ?Preoxygenation: Pre-oxygenation with 100% oxygen ?Induction Type: IV induction ?Ventilation: Mask ventilation without difficulty ?LMA: LMA inserted ?LMA Size: 4.0 ?Number of attempts: 1 ?Placement Confirmation: positive ETCO2 ?Tube secured with: Tape ?Dental Injury: Teeth and Oropharynx as per pre-operative assessment  ? ? ? ? ?

## 2021-06-26 ENCOUNTER — Encounter (HOSPITAL_COMMUNITY): Payer: Self-pay | Admitting: Obstetrics & Gynecology

## 2021-06-28 ENCOUNTER — Ambulatory Visit (HOSPITAL_COMMUNITY)
Admission: EM | Admit: 2021-06-28 | Discharge: 2021-06-28 | Disposition: A | Discharge status: Home / Self Care | Payer: Federal, State, Local not specified - PPO | Attending: Nurse Practitioner | Admitting: Nurse Practitioner

## 2021-06-28 ENCOUNTER — Other Ambulatory Visit: Payer: Self-pay

## 2021-06-28 ENCOUNTER — Encounter (HOSPITAL_COMMUNITY): Payer: Self-pay | Admitting: *Deleted

## 2021-06-28 DIAGNOSIS — M79601 Pain in right arm: Secondary | ICD-10-CM | POA: Insufficient documentation

## 2021-06-28 DIAGNOSIS — I808 Phlebitis and thrombophlebitis of other sites: Secondary | ICD-10-CM | POA: Diagnosis not present

## 2021-06-28 DIAGNOSIS — M79631 Pain in right forearm: Secondary | ICD-10-CM | POA: Diagnosis not present

## 2021-06-28 DIAGNOSIS — M7989 Other specified soft tissue disorders: Secondary | ICD-10-CM | POA: Insufficient documentation

## 2021-06-28 LAB — SURGICAL PATHOLOGY

## 2021-06-28 MED ORDER — AMOXICILLIN-POT CLAVULANATE 875-125 MG PO TABS: 1.0000 | ORAL_TABLET | Freq: Two times a day (BID) | ORAL | 0 refills | Status: AC

## 2021-06-28 NOTE — ED Provider Notes (Signed)
?[facility] ? ? ? ?CSN: [id number] ?Arrival date & time: 06/28/21  1452 ? ? ?  ? ?History   ?Chief Complaint ?Chief Complaint  ?Patient presents with  ? iv site.  ? ? ?HPI ?Kirsten Cooper is a 41 y.o. female.  ? ?Patient presents with pain and swelling to her right forearm after IV insertion a few days ago.  Denies fevers, nausea/vomiting, numbness or tingling in her fingers.   ? ? ?Past Medical History:  ?Diagnosis Date  ? Exertional headache 04/18/2018  ? GERD with esophagitis 09/09/2018  ? ? ?Patient Active Problem List  ? Diagnosis Date Noted  ? Non-viable pregnancy 06/22/2021  ? ADHD (attention deficit hyperactivity disorder) 07/16/2019  ? Exertional headache 04/18/2018  ? ? ?Past Surgical History:  ?Procedure Laterality Date  ? CESAREAN SECTION    ? DILATION AND EVACUATION N/A 06/25/2021  ? Procedure: DILATATION AND EVACUATION;  Surgeon: Adam Phenix, MD;  Location: [facility] OR;  Service: Gynecology;  Laterality: N/A;  ? ? ?OB History   ? ? Gravida  ?4  ? Para  ?2  ? Term  ?2  ? Preterm  ?   ? AB  ?1  ? Living  ?2  ?  ? ? SAB  ?   ? IAB  ?   ? Ectopic  ?   ? Multiple  ?   ? Live Births  ?   ?   ?  ?  ? ? ? ?Home Medications   ? ?Prior to Admission medications   ?Medication Sig Start Date End Date Taking? Authorizing Provider  ?amoxicillin-clavulanate (AUGMENTIN) 875-125 MG tablet Take 1 tablet by mouth 2 (two) times daily for 7 days. 06/28/21 07/05/21 Yes Valentino Nose, NP  ?ascorbic acid (VITAMIN C) 500 MG tablet Take 500 mg by mouth 2 (two) times a week.    [provider]  ?Multiple Vitamin (MULTIVITAMIN) tablet Take 1 tablet by mouth 2 (two) times a week.    [provider]  ?ondansetron (ZOFRAN) 4 MG tablet Take 1 tablet (4 mg total) by mouth every 8 (eight) hours as needed for nausea or vomiting. ?Patient not taking: Reported on 06/24/2021 06/22/21   Venora Maples, MD  ? ? ?Family History ?Family History  ?Problem Relation Age of Onset  ? Alzheimer's disease Paternal  Grandmother   ? ? ?Social History ?Social History  ? ?Tobacco Use  ? Smoking status: Never  ? Smokeless tobacco: Never  ?Vaping Use  ? Vaping Use: Never used  ?Substance Use Topics  ? Alcohol use: Not Currently  ? Drug use: Never  ? ? ? ?Allergies   ?Patient has no known allergies. ? ? ?Review of Systems ?Review of Systems ?Per HPI ? ?Physical Exam ?Triage Vital Signs ?ED Triage Vitals  ?Enc Vitals Group  ?   BP 06/28/21 1530 114/81  ?   Pulse Rate 06/28/21 1530 75  ?   Resp 06/28/21 1530 18  ?   Temp 06/28/21 1530 98.8 ?F (37.1 ?C)  ?   Temp src --   ?   SpO2 06/28/21 1530 97 %  ?   Weight --   ?   Height --   ?   Head Circumference --   ?   Peak Flow --   ?   Pain Score 06/28/21 1527 2  ?   Pain Loc --   ?   Pain Edu? --   ?   Excl. in GC? --   ? ?  No data found. ? ?Updated Vital Signs ?BP 114/81   Pulse 75   Temp 98.8 ?F (37.1 ?C)   Resp 18   LMP 04/14/2021   SpO2 97%   Breastfeeding Unknown  ? ?Visual Acuity ?Right Eye Distance:   ?Left Eye Distance:   ?Bilateral Distance:   ? ?Right Eye Near:   ?Left Eye Near:    ?Bilateral Near:    ? ?Physical Exam ?Vitals and nursing note reviewed.  ?Constitutional:   ?   General: She is not in acute distress. ?   Appearance: Normal appearance. She is not toxic-appearing.  ?Musculoskeletal:  ?   Right forearm: Swelling and tenderness present. No edema, deformity or bony tenderness.  ?   Left forearm: Normal.  ?   Right wrist: Normal. Normal pulse.  ?   Left wrist: Normal.  ?   Right hand: Normal. No bony tenderness. Normal range of motion. Normal strength. Normal sensation. There is no disruption of two-point discrimination. Normal capillary refill. Normal pulse.  ?   Left hand: Normal.  ?   Comments: Palpable, slightly erythematous cord along vein of right forearm; no surrounding edema, erythema, or warmth.   ?Skin: ?   General: Skin is warm and dry.  ?   Capillary Refill: Capillary refill takes less than 2 seconds.  ?   Coloration: Skin is not jaundiced or pale.  ?    Findings: No erythema.  ?Neurological:  ?   Mental Status: She is alert and oriented to person, place, and time.  ?   Motor: No weakness.  ?   Gait: Gait normal.  ?Psychiatric:     ?   Behavior: Behavior is cooperative.  ? ? ? ?UC Treatments / Results  ?Labs ?(all labs ordered are listed, but only abnormal results are displayed) ?Labs Reviewed - No data to display ? ?EKG ? ? ?Radiology ?No results found. ? ?Procedures ?Procedures (including critical care time) ? ?Medications Ordered in UC ?Medications - No data to display ? ?Initial Impression / Assessment and Plan / UC Course  ?I have reviewed the triage vital signs and the nursing notes. ? ?Pertinent labs & imaging results that were available during my care of the patient were reviewed by me and considered in my medical decision making (see chart for details). ? ?  ?Suspect phlebitis.  Treat with Augmentin twice daily for 7 days.  Obtain vascular ultrasound of right upper extremity to rule out acute VTE.  Elevate extremity in meantime.  Seek care if symptoms worsen. ?Final Clinical Impressions(s) / UC Diagnoses  ? ?Final diagnoses:  ?Pain of right forearm  ?Superficial thrombophlebitis of right upper extremity  ? ? ? ?Discharge Instructions   ? ?  ?- We have scheduled you an ultrasound for tomorrow to make sure there is not a blood clot ?- Please start the antibiotic in the meantime and try to keep your arm elevated ?- If your symptoms worsen, please seek are ? ? ? ? ?ED Prescriptions   ? ? Medication Sig Dispense Auth. Provider  ? amoxicillin-clavulanate (AUGMENTIN) 875-125 MG tablet Take 1 tablet by mouth 2 (two) times daily for 7 days. 14 tablet Valentino Nose, NP  ? ?  ? ?PDMP not reviewed this encounter. ?  ?Valentino Nose, NP ?06/28/21 1552 ? ?

## 2021-06-28 NOTE — ED Triage Notes (Signed)
Reports a bump and sorness at IV site from Friday. ?

## 2021-06-28 NOTE — Discharge Instructions (Signed)
-   We have scheduled you an ultrasound for tomorrow to make sure there is not a blood clot ?- Please start the antibiotic in the meantime and try to keep your arm elevated ?- If your symptoms worsen, please seek are ?

## 2021-06-29 ENCOUNTER — Ambulatory Visit (HOSPITAL_BASED_OUTPATIENT_CLINIC_OR_DEPARTMENT_OTHER)
Admit: 2021-06-29 | Discharge: 2021-06-29 | Disposition: A | Discharge status: Home / Self Care | Payer: Federal, State, Local not specified - PPO | Attending: Nurse Practitioner | Admitting: Nurse Practitioner

## 2021-06-29 ENCOUNTER — Other Ambulatory Visit (HOSPITAL_COMMUNITY): Payer: Self-pay | Admitting: Nurse Practitioner

## 2021-06-29 ENCOUNTER — Telehealth: Payer: Self-pay

## 2021-06-29 DIAGNOSIS — M79601 Pain in right arm: Secondary | ICD-10-CM | POA: Diagnosis not present

## 2021-06-29 DIAGNOSIS — I808 Phlebitis and thrombophlebitis of other sites: Secondary | ICD-10-CM | POA: Diagnosis not present

## 2021-06-29 DIAGNOSIS — M7989 Other specified soft tissue disorders: Secondary | ICD-10-CM | POA: Diagnosis not present

## 2021-06-29 DIAGNOSIS — M79631 Pain in right forearm: Secondary | ICD-10-CM | POA: Diagnosis not present

## 2021-06-29 NOTE — Progress Notes (Signed)
Right upper extremity venous duplex completed. ?Refer to "CV Proc" under chart review to view preliminary results. ? ?06/29/2021 9:03 AM ?Eula Fried., MHA, RVT, RDCS, RDMS   ?

## 2021-06-29 NOTE — Telephone Encounter (Signed)
Patient called wanting an order for another Ultrasound. She has an Ultrasound at the Urgent Care and she has a superficial blood clot in her arm and they recommended for her to have another Ultrasound done in a few weeks. ?

## 2021-07-02 ENCOUNTER — Encounter: Payer: Self-pay | Admitting: Medical-Surgical

## 2021-07-02 ENCOUNTER — Ambulatory Visit: Payer: Federal, State, Local not specified - PPO | Admitting: Medical-Surgical

## 2021-07-02 VITALS — BP 113/79 | HR 80 | Resp 20 | Ht 62.0 in | Wt 191.7 lb

## 2021-07-02 DIAGNOSIS — I808 Phlebitis and thrombophlebitis of other sites: Secondary | ICD-10-CM | POA: Diagnosis not present

## 2021-07-02 NOTE — Progress Notes (Signed)
?  HPI with pertinent ROS:  ? ?CC: blood clot in arm ? ?HPI: ?Pleasant 41 year old female presenting today for evaluation of a blood clot in her right arm. She has been undergoing IVF for the past year and reports that she finally got a positive pregnancy test. Unfortunately, the fetus stopped growing and the pregnancy was non-viable. She was for a D&E on 06/25/21 and discharged home without complications. A few days later, she developed swelling and a painful knot on the inner right forearm. Her IV had been on the inner right wrist but there was no issue at the insertion site. She was evaluated on 06/28/21 at [facility] where they did an ultrasound which showed superficial thrombophlebitis and was subsequently treated with Augmentin. She is taking the antibiotic as prescribed and notes an improvement in her symptoms. The swelling has gone down some although the area is still tender. She now has some tenderness extending along the forearm toward the wrist. She was advised by UC to check with her PCP about getting a repeat US in 4-6 weeks to evaluate for resolution.  ? ?I reviewed the past medical history, family history, social history, surgical history, and allergies today and no changes were needed.  Please see the problem list section below in epic for further details. ? ?Physical exam:  ? ?General: Well Developed, well nourished, and in no acute distress.  ?Neuro: Alert and oriented x3.  ?HEENT: Normocephalic, atraumatic.  ?Skin: Warm and dry. Proximal right inner forearm area of tenderness, firm to touch, slightly erythematous.  ?Cardiac: Regular rate and rhythm.  ?Respiratory: Not using accessory muscles, speaking in full sentences. ? ?Impression and Recommendations:   ? ?1. Superficial thrombophlebitis of right upper extremity ?Continue Augmentin as prescribed. Recommend heat/ice, tylenol, or ibuprofen as needed for comfort. Monitor the site over the next 4-5 weeks and if no improvement in symptoms, we can always repeat  the imaging to further evaluate.  ? ?Return if symptoms worsen or fail to improve. ?___________________________________________ ?Thayer Ohm, DNP, APRN, FNP-BC ?Primary Care and Sports Medicine ?[facility] ?

## 2021-08-05 ENCOUNTER — Ambulatory Visit (INDEPENDENT_AMBULATORY_CARE_PROVIDER_SITE_OTHER): Payer: Federal, State, Local not specified - PPO | Admitting: Obstetrics & Gynecology

## 2021-08-05 ENCOUNTER — Encounter: Payer: Self-pay | Admitting: Obstetrics & Gynecology

## 2021-08-05 VITALS — BP 123/85 | HR 87 | Ht 62.5 in | Wt 192.2 lb

## 2021-08-05 DIAGNOSIS — Z09 Encounter for follow-up examination after completed treatment for conditions other than malignant neoplasm: Secondary | ICD-10-CM | POA: Diagnosis not present

## 2021-08-05 DIAGNOSIS — B372 Candidiasis of skin and nail: Secondary | ICD-10-CM

## 2021-08-05 DIAGNOSIS — Z9889 Other specified postprocedural states: Secondary | ICD-10-CM | POA: Diagnosis not present

## 2021-08-05 MED ORDER — NYSTATIN 100000 UNIT/GM EX CREA: 1.0000 "application " | TOPICAL_CREAM | Freq: Two times a day (BID) | CUTANEOUS | 3 refills | Status: DC

## 2021-08-05 MED ORDER — FLUCONAZOLE 150 MG PO TABS: 150.0000 mg | ORAL_TABLET | Freq: Once | ORAL | 3 refills | Status: AC

## 2021-08-05 NOTE — Progress Notes (Signed)
   GYNECOLOGY POSTOPERATIVE NOTE  Subjective:     Kirsten Cooper is a 41 y.o. female who presents to the clinic status post Dilation and Evacuation  on 06/25/2021 for  six weeks missed abortion . Eating a regular diet without difficulty. Bowel movements are normal. The patient is not having any pain.  Reports some chaffing on her inner thighs, wants me to evaluate this.  She is doing overall well, has a lot of support.  The following portions of the patient's history were reviewed and updated as appropriate: allergies, current medications, past family history, past medical history, past social history, past surgical history, and problem list. Normal pap and negative HRHPV on 10/15/2019.  Review of Systems Pertinent items noted in HPI and remainder of comprehensive ROS otherwise negative.    Objective:    BP 123/85   Pulse 87   Ht 5' 2.5" (1.588 m)   Wt 192 lb 3.2 oz (87.2 kg)   LMP 08/05/2021   BMI 34.59 kg/m  General:  alert and no distress  Abdomen: soft, bowel sounds active, non-tender  Pelvic:   Erythematous macular rash on bilateral inguinal areas and upper inner thighs concerning for candidal infection   06/25/21 Surgical Pathology A.   PRODUCTS OF CONCEPTION:  -    Fibrotic chorionic villi.  No fetal somatic tissue identified.  -    Decidua with implantation site, and focal necrosis and inflammation.    Assessment:   Doing well postoperatively. Operative findings again reviewed. Pathology report discussed.    Plan:     1. Continue any current medications. 2. Diflucan and Nystatin cream prescribed for skin yeast infection.  Patient advised to avoid close shaving, tight underwear.  Use cornstarch unscented powder in the area to help prevent the chaffing,  3. Activity restrictions: none 4. Anticipated return to work: not applicable. 5. Follow up for any gynecologic concerns as needed.    Verita Schneiders, MD, [place] for  [employer], [place]

## 2021-08-06 ENCOUNTER — Encounter: Payer: Self-pay | Admitting: Medical-Surgical

## 2021-08-20 ENCOUNTER — Telehealth: Payer: Self-pay

## 2021-08-20 NOTE — Telephone Encounter (Signed)
Received Epic notification that pt has not read MyChart message.      Arm pain/blood clot follow-up  From  Christen Butter, NP To  Leeanne, [place]ters Sent and Delivered  08/06/2021  7:08 AM     Hi [name], I hope this message finds you well.  I just wanted to touch base with you to see how your arm is doing.  As the blood clot and associated discomfort are resolved?  Do you feel like we need to go ahead and get a repeat ultrasound to make sure that it is cleared up? Thanks, Radiographer, therapeutic Last [facility] [employer] with pt who states that her discomfort has resolved and that she feels she is "good to go".  Tiajuana Amass, CMA

## 2021-09-07 ENCOUNTER — Ambulatory Visit: Payer: Federal, State, Local not specified - PPO | Admitting: Medical-Surgical

## 2021-09-07 ENCOUNTER — Encounter: Payer: Self-pay | Admitting: Medical-Surgical

## 2021-09-07 VITALS — Resp 20 | Ht 62.5 in | Wt 195.8 lb

## 2021-09-07 DIAGNOSIS — G4452 New daily persistent headache (NDPH): Secondary | ICD-10-CM | POA: Diagnosis not present

## 2021-09-07 MED ORDER — METHYLPREDNISOLONE 4 MG PO TBPK: ORAL_TABLET | ORAL | 0 refills | Status: DC

## 2021-09-07 NOTE — Progress Notes (Signed)
Established Patient Office Visit  Subjective   Patient ID: Kirsten Cooper, female   DOB: [birth year] Age: 41 y.o. MRN: [id number]   Chief Complaint  Patient presents with   head pressure    HPI Pleasant 41 year old female presenting today with reports of daily headaches that started a little over a week ago.  Notes that the first few days of her headaches started first thing in the morning and involved pressure at her forehead and eyes bilaterally.  On Saturday, she went outside and did quite a bit of yard work.  She was in the sun and did not hydrate well so she feels like she got a little dehydrated.  Since Saturday, the eye pressure and forehead pressure have been constant.  No response to Motrin or Tylenol.  She has been hydrating very well since Saturday however her symptoms have not improved.  When she lays down, the pressure gets much worse so she has been the last few days with her head elevated even while sleeping.  Denies recent illnesses, rhinorrhea, postnasal drip, nasal discharge, fever, chills, ear pain/pressure, sore throat, or cough.  No vision changes, hearing changes, difficulty speaking/swallowing, muscle weakness, difficulty walking, or AMS.   Objective:    Vitals:   09/07/21 1001 09/07/21 1002 09/07/21 1003  Resp: 20 20 20   Height: 5' 2.5" (1.588 m)    Weight: 195 lb 12.8 oz (88.8 kg) 195 lb 12.8 oz (88.8 kg) 195 lb 12.8 oz (88.8 kg)  SpO2: 100% 100% 100%  BMI (Calculated): 35.22 35.22 35.22   Physical Exam Vitals and nursing note reviewed.  Constitutional:      General: She is not in acute distress.    Appearance: Normal appearance. She is not ill-appearing.  HENT:     Head: Normocephalic and atraumatic.     Right Ear: Tympanic membrane, ear canal and external ear normal. There is no impacted cerumen.     Left Ear: Tympanic membrane, ear canal and external ear normal. There is no impacted cerumen.     Nose: Nose normal.     Mouth/Throat:     Mouth: Mucous  membranes are moist.  Eyes:     General:        Right eye: No discharge.        Left eye: No discharge.     Extraocular Movements: Extraocular movements intact.     Conjunctiva/sclera: Conjunctivae normal.     Pupils: Pupils are equal, round, and reactive to light.  Cardiovascular:     Rate and Rhythm: Normal rate and regular rhythm.     Pulses: Normal pulses.     Heart sounds: Normal heart sounds.  Pulmonary:     Effort: Pulmonary effort is normal. No respiratory distress.     Breath sounds: Normal breath sounds. No wheezing, rhonchi or rales.  Skin:    General: Skin is warm and dry.  Neurological:     Mental Status: She is alert and oriented to person, place, and time.  Psychiatric:        Mood and Affect: Mood normal.        Behavior: Behavior normal.        Thought Content: Thought content normal.        Judgment: Judgment normal.    No results found for this or any previous visit (from the past 24 hour(s)).     The 10-year ASCVD risk score (Arnett DK, et al., 2019) is: 0.7%   Values used to calculate the score:  Age: 89 years     Sex: Female     Is Non-Hispanic African American: Yes     Diabetic: No     Tobacco smoker: No     Systolic Blood Pressure: 123 mmHg     Is BP treated: No     HDL Cholesterol: 54 mg/dL     Total Cholesterol: 204 mg/dL   Assessment & Plan:   1. New daily persistent headache Unclear etiology.  No recent illness to indicate possible bacterial/viral infection.  She did get a bit dehydrated on Saturday however her symptoms started before that.  On exam, no concerning findings outside of some dizziness with position changes.  Vestibular migraine is certainly in the differential.  Recommend starting Flonase nasal spray twice daily for 3 days then reduce to once daily dosing.  Start a daily oral antihistamine such as Zyrtec or Claritin.  We will go ahead and treat with a Medrol Dosepak to see if this provides any benefit.  Advised patient to  monitor for symptoms and if they worsen or fail to improve with these interventions, she should return for further evaluation.  Return if symptoms worsen or fail to improve.  ___________________________________________ Thayer Ohm, DNP, APRN, FNP-BC Primary Care and Sports Medicine [facility]

## 2021-09-08 ENCOUNTER — Encounter: Payer: Self-pay | Admitting: Medical-Surgical

## 2021-10-13 DIAGNOSIS — H47012 Ischemic optic neuropathy, left eye: Secondary | ICD-10-CM | POA: Diagnosis not present

## 2021-10-14 ENCOUNTER — Other Ambulatory Visit: Payer: Self-pay | Admitting: Ophthalmology

## 2021-10-14 DIAGNOSIS — H47012 Ischemic optic neuropathy, left eye: Secondary | ICD-10-CM

## 2021-10-20 ENCOUNTER — Ambulatory Visit
Admission: RE | Admit: 2021-10-20 | Discharge: 2021-10-20 | Disposition: A | Discharge status: Home / Self Care | Payer: Federal, State, Local not specified - PPO | Source: Ambulatory Visit | Attending: Ophthalmology | Admitting: Ophthalmology

## 2021-10-20 DIAGNOSIS — H47012 Ischemic optic neuropathy, left eye: Secondary | ICD-10-CM | POA: Insufficient documentation

## 2021-10-20 DIAGNOSIS — R519 Headache, unspecified: Secondary | ICD-10-CM | POA: Diagnosis not present

## 2021-10-20 MED ORDER — GADOBUTROL 1 MMOL/ML IV SOLN
7.5000 mL | Freq: Once | INTRAVENOUS | Status: AC | PRN
  Administered 2021-10-20: 7.5 mL via INTRAVENOUS

## 2021-10-25 ENCOUNTER — Other Ambulatory Visit: Payer: Self-pay | Admitting: Ophthalmology

## 2021-10-25 DIAGNOSIS — H47012 Ischemic optic neuropathy, left eye: Secondary | ICD-10-CM

## 2021-10-27 ENCOUNTER — Other Ambulatory Visit: Payer: Self-pay | Admitting: Ophthalmology

## 2021-10-27 ENCOUNTER — Ambulatory Visit: Payer: Federal, State, Local not specified - PPO | Admitting: Physician Assistant

## 2021-10-27 DIAGNOSIS — R519 Headache, unspecified: Secondary | ICD-10-CM

## 2021-10-27 DIAGNOSIS — H47012 Ischemic optic neuropathy, left eye: Secondary | ICD-10-CM

## 2021-10-29 ENCOUNTER — Telehealth (INDEPENDENT_AMBULATORY_CARE_PROVIDER_SITE_OTHER): Payer: Federal, State, Local not specified - PPO | Admitting: Medical-Surgical

## 2021-10-29 DIAGNOSIS — R051 Acute cough: Secondary | ICD-10-CM

## 2021-10-29 MED ORDER — AZITHROMYCIN 250 MG PO TABS: ORAL_TABLET | ORAL | 0 refills | Status: AC

## 2021-10-29 MED ORDER — ALBUTEROL SULFATE HFA 108 (90 BASE) MCG/ACT IN AERS: 2.0000 | INHALATION_SPRAY | Freq: Four times a day (QID) | RESPIRATORY_TRACT | 11 refills | Status: DC | PRN

## 2021-10-29 MED ORDER — BENZONATATE 200 MG PO CAPS
200.0000 mg | ORAL_CAPSULE | Freq: Three times a day (TID) | ORAL | 0 refills | Status: DC | PRN
Start: 2021-10-29 — End: 2021-11-22

## 2021-10-29 NOTE — Progress Notes (Signed)
Virtual Visit via Video Note  I connected with Kirsten Cooper on 10/29/21 at  1:00 PM EDT by a video enabled telemedicine application and verified that I am speaking with the correct person using two identifiers.   I discussed the limitations of evaluation and management by telemedicine and the availability of in person appointments. The patient expressed understanding and agreed to proceed.  Patient location: home Provider locations: office  Subjective:    CC: cough x 3 weeks  HPI: Pleasant 41 year old female presenting today via MyChart video visit for evaluation of a cough that has been present for approximately 3 weeks.  States she originally started out feeling under the weather however this gradually moved into her chest.  She has tried Alka-Seltzer cold and Mucinex DM with no help.  She actually had side effects from both of those medications and reports they made her feel worse.  Today, she reports that she feels fine however the cough is nagging and will go away.  Deep breathing and talking make the cough worse and she has not found anything that will take a very completely.  Notes that the cough is more dry now than it was.  No other associated symptoms.  Has taken several COVID test with negative results.  Reports that she did go to see her eye doctor and they noticed some swelling in the posterior of the eye.  They did do an MRI of the orbits and advised her to start aspirin.  She has another MRI of the brain scheduled for August 21 and a lumbar puncture scheduled for August 24.  She will make sure to let me know what the testing reveals.  Past medical history, Surgical history, Family history not pertinant except as noted below, Social history, Allergies, and medications have been entered into the medical record, reviewed, and corrections made.   Review of Systems: See HPI for pertinent positives and negatives.   Objective:    General: Speaking clearly in complete sentences  without any shortness of breath.  Alert and oriented x3.  Normal judgment. No apparent acute distress.  Impression and Recommendations:    1. Acute cough Consider postviral cough syndrome versus acute bronchitis.  We will go ahead and treat empirically with azithromycin 500 mg today then 250 mg daily times the next 4 days.  Adding an albuterol inhaler to help open airways.  Offered a course of steroids however patient declined due to side effects and tolerance concerns.  Adding Tessalon Perles every 8 hours as needed.  Recommend trialing regular Mucinex and pushing p.o. fluids.  I discussed the assessment and treatment plan with the patient. The patient was provided an opportunity to ask questions and all were answered. The patient agreed with the plan and demonstrated an understanding of the instructions.   The patient was advised to call back or seek an in-person evaluation if the symptoms worsen or if the condition fails to improve as anticipated.  25 minutes of non-face-to-face time was provided during this encounter.  Return if symptoms worsen or fail to improve.  Thayer Ohm, DNP, APRN, FNP-BC [facility] and Sports Medicine

## 2021-11-01 ENCOUNTER — Ambulatory Visit
Admission: RE | Admit: 2021-11-01 | Discharge: 2021-11-01 | Disposition: A | Discharge status: Home / Self Care | Payer: Federal, State, Local not specified - PPO | Source: Ambulatory Visit | Attending: Ophthalmology | Admitting: Ophthalmology

## 2021-11-01 DIAGNOSIS — H47012 Ischemic optic neuropathy, left eye: Secondary | ICD-10-CM | POA: Diagnosis not present

## 2021-11-01 DIAGNOSIS — H469 Unspecified optic neuritis: Secondary | ICD-10-CM | POA: Diagnosis not present

## 2021-11-01 DIAGNOSIS — J329 Chronic sinusitis, unspecified: Secondary | ICD-10-CM | POA: Diagnosis not present

## 2021-11-01 MED ORDER — GADOBUTROL 1 MMOL/ML IV SOLN
7.5000 mL | Freq: Once | INTRAVENOUS | Status: AC | PRN
  Administered 2021-11-01: 7.5 mL via INTRAVENOUS

## 2021-11-04 ENCOUNTER — Ambulatory Visit
Admission: RE | Admit: 2021-11-04 | Discharge: 2021-11-04 | Disposition: A | Discharge status: Home / Self Care | Payer: Federal, State, Local not specified - PPO | Source: Ambulatory Visit | Attending: Ophthalmology | Admitting: Ophthalmology

## 2021-11-04 DIAGNOSIS — H47012 Ischemic optic neuropathy, left eye: Secondary | ICD-10-CM | POA: Insufficient documentation

## 2021-11-04 DIAGNOSIS — R519 Headache, unspecified: Secondary | ICD-10-CM | POA: Insufficient documentation

## 2021-11-04 DIAGNOSIS — G4489 Other headache syndrome: Secondary | ICD-10-CM | POA: Diagnosis not present

## 2021-11-04 MED ORDER — LIDOCAINE HCL (PF) 1 % IJ SOLN
10.0000 mL | Freq: Once | INTRAMUSCULAR | Status: AC
  Administered 2021-11-04: 10 mL

## 2021-11-04 NOTE — Progress Notes (Signed)
Patient has remained clinically stable, decreasing HA complaints post procedure. Husband at bedside with discharge instructions given, ie increased hydration over 24 hours along with bedrest as much as possible with head flat. Stated understanding.vitals stable

## 2021-11-10 ENCOUNTER — Other Ambulatory Visit: Payer: Self-pay

## 2021-11-10 ENCOUNTER — Emergency Department (HOSPITAL_COMMUNITY): Payer: Federal, State, Local not specified - PPO

## 2021-11-10 ENCOUNTER — Encounter (HOSPITAL_COMMUNITY): Payer: Self-pay | Admitting: Emergency Medicine

## 2021-11-10 ENCOUNTER — Emergency Department (HOSPITAL_COMMUNITY)
Admission: EM | Admit: 2021-11-10 | Discharge: 2021-11-11 | Disposition: A | Discharge status: Home / Self Care | Payer: Federal, State, Local not specified - PPO | Attending: Emergency Medicine | Admitting: Emergency Medicine

## 2021-11-10 DIAGNOSIS — I10 Essential (primary) hypertension: Secondary | ICD-10-CM | POA: Insufficient documentation

## 2021-11-10 DIAGNOSIS — R519 Headache, unspecified: Secondary | ICD-10-CM | POA: Diagnosis not present

## 2021-11-10 DIAGNOSIS — H47012 Ischemic optic neuropathy, left eye: Secondary | ICD-10-CM | POA: Diagnosis not present

## 2021-11-10 DIAGNOSIS — Z79899 Other long term (current) drug therapy: Secondary | ICD-10-CM | POA: Diagnosis not present

## 2021-11-10 DIAGNOSIS — G08 Intracranial and intraspinal phlebitis and thrombophlebitis: Secondary | ICD-10-CM | POA: Diagnosis not present

## 2021-11-10 LAB — CBC WITH DIFFERENTIAL/PLATELET
Abs Immature Granulocytes: 0.04 10*3/uL (ref 0.00–0.07)
Basophils Absolute: 0.1 10*3/uL (ref 0.0–0.1)
Basophils Relative: 1 %
Eosinophils Absolute: 0.3 10*3/uL (ref 0.0–0.5)
Eosinophils Relative: 2 %
HCT: 41 % (ref 36.0–46.0)
Hemoglobin: 13.1 g/dL (ref 12.0–15.0)
Immature Granulocytes: 0 %
Lymphocytes Relative: 24 %
Lymphs Abs: 2.9 10*3/uL (ref 0.7–4.0)
MCH: 27.5 pg (ref 26.0–34.0)
MCHC: 32 g/dL (ref 30.0–36.0)
MCV: 86 fL (ref 80.0–100.0)
Monocytes Absolute: 0.8 10*3/uL (ref 0.1–1.0)
Monocytes Relative: 7 %
Neutro Abs: 7.9 10*3/uL — ABNORMAL HIGH (ref 1.7–7.7)
Neutrophils Relative %: 66 %
Platelets: 356 10*3/uL (ref 150–400)
RBC: 4.77 MIL/uL (ref 3.87–5.11)
RDW: 14.5 % (ref 11.5–15.5)
WBC: 12.1 10*3/uL — ABNORMAL HIGH (ref 4.0–10.5)
nRBC: 0 % (ref 0.0–0.2)

## 2021-11-10 LAB — BASIC METABOLIC PANEL
Anion gap: 7 (ref 5–15)
BUN: 9 mg/dL (ref 6–20)
CO2: 24 mmol/L (ref 22–32)
Calcium: 8.8 mg/dL — ABNORMAL LOW (ref 8.9–10.3)
Chloride: 106 mmol/L (ref 98–111)
Creatinine, Ser: 0.83 mg/dL (ref 0.44–1.00)
GFR, Estimated: >60 mL/min (ref >60)
Glucose, Bld: 86 mg/dL (ref 70–99)
Potassium: 4.1 mmol/L (ref 3.5–5.1)
Sodium: 137 mmol/L (ref 135–145)

## 2021-11-10 MED ORDER — KETOROLAC TROMETHAMINE 15 MG/ML IJ SOLN
15.0000 mg | Freq: Once | INTRAMUSCULAR | Status: AC
  Administered 2021-11-10: 15 mg via INTRAVENOUS
  Filled 2021-11-10: qty 1

## 2021-11-10 MED ORDER — DIPHENHYDRAMINE HCL 50 MG/ML IJ SOLN
50.0000 mg | Freq: Once | INTRAMUSCULAR | Status: AC
Start: 2021-11-10 — End: 2021-11-10
  Administered 2021-11-10: 50 mg via INTRAVENOUS
  Filled 2021-11-10: qty 1

## 2021-11-10 MED ORDER — LACTATED RINGERS IV BOLUS
1000.0000 mL | Freq: Once | INTRAVENOUS | Status: AC
  Administered 2021-11-10: 1000 mL via INTRAVENOUS

## 2021-11-10 MED ORDER — PROCHLORPERAZINE EDISYLATE 10 MG/2ML IJ SOLN
10.0000 mg | Freq: Once | INTRAMUSCULAR | Status: AC
  Administered 2021-11-10: 10 mg via INTRAVENOUS
  Filled 2021-11-10: qty 2

## 2021-11-10 NOTE — ED Triage Notes (Signed)
Pt reports having an LP on Thursday 8/24, reports worsening headache since Monday. Denies visual changes, c/o eye pain. A&Ox4, no weakness noted.

## 2021-11-10 NOTE — ED Provider Triage Note (Signed)
Emergency Medicine Provider Triage Evaluation Note  Kirsten Cooper , a 41 y.o. female  was evaluated in triage.  Pt complains of increasing headache following LP on 11/04/21.  First three days was improved with lying flat, however now worsened with same.  Denies fever, neck stiffness, or vomiting, however endorses nausea.  Also complaining of increased pressure behind eyes and in the back of her head, with some occasional dizziness if moving too fast.  Review of Systems  Positive:  Negative: See above  Physical Exam  BP 116/83 (BP Location: Right Arm)   Pulse 71   Temp 97.9 F (36.6 C)   Resp 17   LMP 10/14/2021 (Approximate) Comment: waiver signed  SpO2 100%  Gen:   Awake, no distress   Resp:  Normal effort  MSK:   Moves extremities without difficulty  Other:  Neck very supple on exam. No meningismus or torticollis.  Gaze aligned appropriately.  EOMs grossly intact.  Medical Decision Making  Medically screening exam initiated at 8:19 PM.  Appropriate orders placed.  Kirsten Cooper was informed that the remainder of the evaluation will be completed by another provider, this initial triage assessment does not replace that evaluation, and the importance of remaining in the ED until their evaluation is complete.     Cecil Cobbs, PA-C 11/10/21 2022

## 2021-11-10 NOTE — ED Provider Notes (Signed)
[facility] EMERGENCY DEPARTMENT Provider Note   CSN: [id number] Arrival date & time: 11/10/21  1814     History Chief Complaint  Patient presents with   Headache    HPI Kirsten Cooper is a 41 y.o. female presenting for complex headache.  She has had 3 weeks of headaches with slow progression in the outpatient setting evaluated with neurology and found to be intracranial idiopathic hypertension with a an opening pressure of 40 drained to 16 a week ago.  Her headache has gradually returned since her procedure.  Notably, she has not started her Diamox as she wants to be seen by neurology prior to starting this new medication.  She endorses a history of side effects to multiple drugs and wants to discuss alternative treatment options. She denies fevers or chills nausea or vomiting, syncope or shortness of breath..   Patient's recorded medical, surgical, social, medication list and allergies were reviewed in the Snapshot window as part of the initial history.   Review of Systems   Review of Systems  Constitutional:  Negative for chills and fever.  HENT:  Negative for ear pain and sore throat.   Eyes:  Negative for pain and visual disturbance.  Respiratory:  Negative for cough and shortness of breath.   Cardiovascular:  Negative for chest pain and palpitations.  Gastrointestinal:  Negative for abdominal pain and vomiting.  Genitourinary:  Negative for dysuria and hematuria.  Musculoskeletal:  Negative for arthralgias and back pain.  Skin:  Negative for color change and rash.  Neurological:  Positive for headaches. Negative for seizures and syncope.  Psychiatric/Behavioral:  The patient is not hyperactive.   All other systems reviewed and are negative.   Physical Exam Updated Vital Signs BP 125/70   Pulse 84   Temp 97.9 F (36.6 C)   Resp 16   Ht 5' 2.5" (1.588 m)   Wt 85.7 kg   LMP 10/14/2021 (Approximate) Comment: waiver signed  SpO2 100%   BMI 34.02 kg/m   Physical Exam Vitals and nursing note reviewed.  Constitutional:      General: She is not in acute distress.    Appearance: She is well-developed.  HENT:     Head: Normocephalic and atraumatic.  Eyes:     Conjunctiva/sclera: Conjunctivae normal.  Cardiovascular:     Rate and Rhythm: Normal rate and regular rhythm.     Heart sounds: No murmur heard. Pulmonary:     Effort: Pulmonary effort is normal. No respiratory distress.     Breath sounds: Normal breath sounds.  Abdominal:     General: There is no distension.     Palpations: Abdomen is soft.     Tenderness: There is no abdominal tenderness. There is no right CVA tenderness or left CVA tenderness.  Musculoskeletal:        General: No swelling or tenderness. Normal range of motion.     Cervical back: Neck supple.  Skin:    General: Skin is warm and dry.  Neurological:     General: No focal deficit present.     Mental Status: She is alert and oriented to person, place, and time. Mental status is at baseline.     Cranial Nerves: No cranial nerve deficit.      ED Course/ Medical Decision Making/ A&P    Procedures Procedures   Medications Ordered in ED Medications  prochlorperazine (COMPAZINE) injection 10 mg (10 mg Intravenous Given 11/10/21 2237)  lactated ringers bolus 1,000 mL (1,000 mLs Intravenous  New Bag/Given 11/10/21 2241)  diphenhydrAMINE (BENADRYL) injection 50 mg (50 mg Intravenous Given 11/10/21 2234)  ketorolac (TORADOL) 15 MG/ML injection 15 mg (15 mg Intravenous Given 11/10/21 2236)   Medical Decision Making:   [name] is a 41 y.o. female who presented to the ED today with headache detailed above.    External chart has been reviewed including outside facility interventions including LP, multiple MRIs. Patient's presentation is complicated by their history of elevated BMI.  Patient placed on continuous vitals and telemetry monitoring while in ED which was reviewed periodically.   Complete initial  physical exam performed, notably the patient  was hemodynamically stable in no acute distress.    Reviewed and confirmed nursing documentation for past medical history, family history, social history.    Initial Assessment:   With the patient's presentation of headache, most likely diagnosis is tension type headaches vs atypical migraines. Other diagnoses were considered including (but not limited to) intracranial mass, intracranial hemorrhage, intracranial infection including meningitis vs encephalitis, GCA, trigeminal neuralgia. These are considered less likely due to history of present illness and physical exam findings.    This is most consistent with an acute life/limb threatening illness complicated by underlying chronic conditions.   Timeline and slow onset is not consistent with SAH/ICH   Age and description of pain is not consistent with GCA   Lack of fever,meningismus is not consistent with meningitis/encephalitis   Initial Plan:  Will initiate treatment with Compazine/Benardryll/NSAIDS/Tylenol for treatment of nonspecific headache  Screening labs including CBC and Metabolic panel to evaluate for infectious or metabolic etiology of disease.  Patient requesting venography to evaluate for dural venous sinus thrombosis.  While this seems less likely in the setting of diagnosed IIH, it is reasonable to consider given patient's history of venous thrombosis in the past.  Shared medical decision making with patient regarding risks of contrast risk for radiation versus benefit of further diagnostic evaluation for her headache and patient with like to proceed with CTV at this time Plan for symptomatic treatment with plan for reassessment after medication administration Objective evaluation as below reviewed   Initial Study Results:   Laboratory  All laboratory results reviewed without evidence of clinically relevant pathology.     Final Assessment and Plan:   On repeat assessment, patient's  headache is completely gone, she endorses significant symptomatic improvement.  However her CTV has not been performed at this time.  Care patient handed off to oncoming provider pending this result.  Legrand Rams that will likely be negative and patient be stable for continued outpatient care and management with neurology as discussed.  Clinical Impression:  1. Nonintractable headache, unspecified chronicity pattern, unspecified headache type      Data Unavailable   Final Clinical Impression(s) / ED Diagnoses Final diagnoses:  Nonintractable headache, unspecified chronicity pattern, unspecified headache type    Rx / DC Orders ED Discharge Orders     None         Glyn Ade, MD 11/10/21 2334

## 2021-11-10 NOTE — ED Provider Notes (Signed)
Care assumed from Dr. Doran Durand, patient with Ideopathic Intracranial Hypertension and follow-up with neurology arranged, pending CT venogram of head with plan for discharge if negative.  She has already gotten headache relief with a migraine cocktail.  CT venogram shows empty sella but no other pathology.  I have independently viewed the images, and agree with radiologist interpretation.  Patient is discharged with instructions to follow-up with her neurologist.  Results for orders placed or performed during the hospital encounter of 11/10/21  CBC with Differential  Result Value Ref Range   WBC 12.1 (H) 4.0 - 10.5 K/uL   RBC 4.77 3.87 - 5.11 MIL/uL   Hemoglobin 13.1 12.0 - 15.0 g/dL   HCT 10.2 58.5 - 27.7 %   MCV 86.0 80.0 - 100.0 fL   MCH 27.5 26.0 - 34.0 pg   MCHC 32.0 30.0 - 36.0 g/dL   RDW 82.4 23.5 - 36.1 %   Platelets 356 150 - 400 K/uL   nRBC 0.0 0.0 - 0.2 %   Neutrophils Relative % 66 %   Neutro Abs 7.9 (H) 1.7 - 7.7 K/uL   Lymphocytes Relative 24 %   Lymphs Abs 2.9 0.7 - 4.0 K/uL   Monocytes Relative 7 %   Monocytes Absolute 0.8 0.1 - 1.0 K/uL   Eosinophils Relative 2 %   Eosinophils Absolute 0.3 0.0 - 0.5 K/uL   Basophils Relative 1 %   Basophils Absolute 0.1 0.0 - 0.1 K/uL   Immature Granulocytes 0 %   Abs Immature Granulocytes 0.04 0.00 - 0.07 K/uL  Basic metabolic panel  Result Value Ref Range   Sodium 137 135 - 145 mmol/L   Potassium 4.1 3.5 - 5.1 mmol/L   Chloride 106 98 - 111 mmol/L   CO2 24 22 - 32 mmol/L   Glucose, Bld 86 70 - 99 mg/dL   BUN 9 6 - 20 mg/dL   Creatinine, Ser 4.43 0.44 - 1.00 mg/dL   Calcium 8.8 (L) 8.9 - 10.3 mg/dL   GFR, Estimated >15 >40 mL/min   Anion gap 7 5 - 15   CT VENOGRAM HEAD  Result Date: 11/11/2021 CLINICAL DATA:  Initial evaluation for dural venous sinus thrombosis. EXAM: CT VENOGRAM HEAD TECHNIQUE: Venographic phase images of the brain were obtained following the administration of intravenous contrast. Multiplanar reformats  and maximum intensity projections were generated. RADIATION DOSE REDUCTION: This exam was performed according to the departmental dose-optimization program which includes automated exposure control, adjustment of the mA and/or kV according to patient size and/or use of iterative reconstruction technique. CONTRAST:  102mL OMNIPAQUE IOHEXOL 350 MG/ML SOLN COMPARISON:  Prior study from 11/01/2021. FINDINGS: Brain: Cerebral volume within normal limits for patient age. No evidence for acute intracranial hemorrhage. No findings to suggest acute large vessel territory infarct. No mass lesion, midline shift, or mass effect. Ventricles are normal in size without evidence for hydrocephalus. No extra-axial fluid collection identified. Empty sella noted. Vascular: No hyperdense vessel seen prior to contrast administration. Following contrast administration, normal enhancement seen throughout the superior sagittal sinus to the torcula. Transverse and sigmoid sinuses are patent as are the jugular bulbs and visualized proximal internal jugular veins. Left transverse sinus dominant. Straight sinus, vein of Galen, internal cerebral veins, and basal veins of Rosenthal appear patent. No appreciable abnormality about the cavernous sinus. Superior orbital veins symmetric and within normal limits. No cortical vein abnormality. No evidence for dural sinus thrombosis. Skull: Scalp soft tissues demonstrate no acute abnormality. Calvarium intact. Sinuses/Orbits: Globes and orbital  soft tissues within normal limits. Visualized paranasal sinuses are clear. No mastoid effusion. IMPRESSION: 1. Normal CTV. No evidence for dural sinus thrombosis or other acute abnormality. 2. Empty sella. Electronically Signed   By: Rise Mu M.D.   On: 11/11/2021 00:37    Dione Booze, MD 11/11/21 [phone]

## 2021-11-10 NOTE — ED Notes (Signed)
RN assumed care, pt alert and oriented.  She reports headache is gone w/ the exception of the slight pressure behind her eyes.  She is able to walk to the bathroom w/o any assistance.

## 2021-11-10 NOTE — ED Notes (Signed)
Pt to CT

## 2021-11-11 DIAGNOSIS — G08 Intracranial and intraspinal phlebitis and thrombophlebitis: Secondary | ICD-10-CM | POA: Diagnosis not present

## 2021-11-11 MED ORDER — IOHEXOL 350 MG/ML SOLN
100.0000 mL | Freq: Once | INTRAVENOUS | Status: AC | PRN
  Administered 2021-11-11: 75 mL via INTRAVENOUS

## 2021-11-17 ENCOUNTER — Encounter: Payer: Self-pay | Admitting: Medical-Surgical

## 2021-11-18 DIAGNOSIS — R519 Headache, unspecified: Secondary | ICD-10-CM | POA: Diagnosis not present

## 2021-11-18 DIAGNOSIS — G932 Benign intracranial hypertension: Secondary | ICD-10-CM | POA: Diagnosis not present

## 2021-11-18 DIAGNOSIS — R42 Dizziness and giddiness: Secondary | ICD-10-CM | POA: Diagnosis not present

## 2021-11-18 NOTE — Telephone Encounter (Signed)
Called patient to schedule a hospital follow up, and there was no answer. I left a message to call back to set up an appt. for a hospital follow up. tvt

## 2021-11-21 NOTE — Progress Notes (Unsigned)
Established Patient Office Visit  Subjective   Patient ID: Kirsten Cooper, female   DOB: [birth year] Age: 41 y.o. MRN: [id number]   No chief complaint on file.   HPI Pleasant 41 year old female presenting today for a hospital discharge follow up. Over the past couple of months, she has been under the care of Ophthalmology who has done several MRI's that found signs of idiopathic intracranial hypertension and an empty sella. She has also undergone a lumbar puncture. On 8/30, she presented to the ED with complaints of a nonintractable headache. She was fully evaluated in the ED, including a CT Venogram with no concerning findings. She was treated with a migraine cocktail with full resolution of her symptoms. On 9/7, she was evaluated by Neurology with [facility] where she was started on Diamox and prescribed sodium restriction and a nutrition consult. They have recommended close follow ups with her Ophthalmologist for serial eye exams.  Today, she reports that she is doing very well.  Her headaches have resolved and she has had no issues since she received a migraine cocktail in the ED.  She does note that she did not tolerate the migraine cocktail very well as it made her severely anxious.  She has follow-up appointment in place with neurology and ophthalmology already.  She does have several questions today.  She is interested in discussing her labs, specifically the elevated [place] blood cells.  She also has some pain along the left anterior knee just below the patella that has been bothersome.  Also notes that she has been worried about the hump at the back of her neck that has developed over the last couple of years.   Objective:    Vitals:   11/22/21 0818  BP: 108/75  Pulse: 69  Resp: 20  Height: 5' 2.5" (1.588 m)  Weight: 192 lb 3.2 oz (87.2 kg)  SpO2: 100%  BMI (Calculated): 34.57    Physical Exam Vitals and nursing note reviewed.  Constitutional:      General: She is not in acute  distress.    Appearance: Normal appearance. She is not ill-appearing.  HENT:     Head: Normocephalic and atraumatic.  Neck:   Cardiovascular:     Rate and Rhythm: Normal rate and regular rhythm.     Pulses: Normal pulses.     Heart sounds: Normal heart sounds.  Pulmonary:     Effort: Pulmonary effort is normal. No respiratory distress.     Breath sounds: Normal breath sounds. No wheezing, rhonchi or rales.  Musculoskeletal:     Left knee: No tenderness.       Legs:  Skin:    General: Skin is warm and dry.  Neurological:     Mental Status: She is alert and oriented to person, place, and time.  Psychiatric:        Mood and Affect: Mood normal.        Behavior: Behavior normal.        Thought Content: Thought content normal.        Judgment: Judgment normal.    No results found for this or any previous visit (from the past 24 hour(s)).     The 10-year ASCVD risk score (Arnett DK, et al., 2019) is: 0.4%   Values used to calculate the score:     Age: 18 years     Sex: Female     Is Non-Hispanic African American: Yes     Diabetic: No     Tobacco smoker:  No     Systolic Blood Pressure: 108 mmHg     Is BP treated: No     HDL Cholesterol: 54 mg/dL     Total Cholesterol: 204 mg/dL   Assessment & Plan:   1. Hospital discharge follow-up 2. Idiopathic intracranial hypertension Review of hospital records as well as notes from neurology and ophthalmology.  She has appropriate follow-up already scheduled and is currently stable with no acute concerns.  3. Pes anserine bursitis Area of tenderness along the left medial anterior knee consistent with Pes anserine bursitis.  Discussed recommendations for conservative treatment including RICE, anti-inflammatories, and gentle massage.  4. Buffalo hump Reviewed etiology of the buffalo hump.  She does have a very small area affected right now.  Likely will benefit from weight loss to healthy weight.  Also reviewed exercises to help with  neck and upper back mobility which may also provide some relief of her symptoms.  5. Neutrophilia Suspect that her neutrophilia is directly related to the lumbar puncture.  This should likely resolve on its own over the next couple of weeks.  6. Attention deficit hyperactivity disorder (ADHD), combined type Discussed various options for treatment of ADHD.  She does have IH so I would like to avoid stimulant medications at this time.  I did give her a list of medications as well as drug information handouts with her AVS to consider.  She will let me know if she would like to start a medication.  Return in about 6 months (around 05/23/2022) for chronic disease follow up.  ___________________________________________ Thayer Ohm, DNP, APRN, FNP-BC Primary Care and Sports Medicine [facility] [place]

## 2021-11-22 ENCOUNTER — Encounter: Payer: Self-pay | Admitting: Medical-Surgical

## 2021-11-22 ENCOUNTER — Ambulatory Visit: Payer: Federal, State, Local not specified - PPO | Admitting: Medical-Surgical

## 2021-11-22 VITALS — BP 108/75 | HR 69 | Resp 20 | Ht 62.5 in | Wt 192.2 lb

## 2021-11-22 DIAGNOSIS — D729 Disorder of white blood cells, unspecified: Secondary | ICD-10-CM

## 2021-11-22 DIAGNOSIS — E65 Localized adiposity: Secondary | ICD-10-CM

## 2021-11-22 DIAGNOSIS — Z09 Encounter for follow-up examination after completed treatment for conditions other than malignant neoplasm: Secondary | ICD-10-CM

## 2021-11-22 DIAGNOSIS — M7050 Other bursitis of knee, unspecified knee: Secondary | ICD-10-CM

## 2021-11-22 DIAGNOSIS — G932 Benign intracranial hypertension: Secondary | ICD-10-CM

## 2021-11-22 DIAGNOSIS — F902 Attention-deficit hyperactivity disorder, combined type: Secondary | ICD-10-CM

## 2021-12-02 ENCOUNTER — Other Ambulatory Visit: Payer: Self-pay | Admitting: Physician Assistant

## 2021-12-02 DIAGNOSIS — R519 Headache, unspecified: Secondary | ICD-10-CM

## 2021-12-02 DIAGNOSIS — G932 Benign intracranial hypertension: Secondary | ICD-10-CM

## 2021-12-09 ENCOUNTER — Ambulatory Visit
Admission: RE | Admit: 2021-12-09 | Discharge: 2021-12-09 | Disposition: A | Discharge status: Home / Self Care | Payer: Federal, State, Local not specified - PPO | Source: Ambulatory Visit | Attending: Physician Assistant | Admitting: Physician Assistant

## 2021-12-09 DIAGNOSIS — R519 Headache, unspecified: Secondary | ICD-10-CM | POA: Diagnosis not present

## 2021-12-09 DIAGNOSIS — G932 Benign intracranial hypertension: Secondary | ICD-10-CM | POA: Insufficient documentation

## 2021-12-09 DIAGNOSIS — M50321 Other cervical disc degeneration at C4-C5 level: Secondary | ICD-10-CM | POA: Diagnosis not present

## 2021-12-13 DIAGNOSIS — G932 Benign intracranial hypertension: Secondary | ICD-10-CM | POA: Diagnosis not present

## 2021-12-16 DIAGNOSIS — E538 Deficiency of other specified B group vitamins: Secondary | ICD-10-CM | POA: Diagnosis not present

## 2021-12-16 DIAGNOSIS — E559 Vitamin D deficiency, unspecified: Secondary | ICD-10-CM | POA: Diagnosis not present

## 2021-12-16 DIAGNOSIS — R4789 Other speech disturbances: Secondary | ICD-10-CM | POA: Diagnosis not present

## 2021-12-16 DIAGNOSIS — R519 Headache, unspecified: Secondary | ICD-10-CM | POA: Diagnosis not present

## 2021-12-16 DIAGNOSIS — R413 Other amnesia: Secondary | ICD-10-CM | POA: Diagnosis not present

## 2021-12-16 DIAGNOSIS — G932 Benign intracranial hypertension: Secondary | ICD-10-CM | POA: Diagnosis not present

## 2021-12-16 DIAGNOSIS — R42 Dizziness and giddiness: Secondary | ICD-10-CM | POA: Diagnosis not present

## 2021-12-16 DIAGNOSIS — Z8659 Personal history of other mental and behavioral disorders: Secondary | ICD-10-CM | POA: Diagnosis not present

## 2021-12-16 DIAGNOSIS — Z114 Encounter for screening for human immunodeficiency virus [HIV]: Secondary | ICD-10-CM | POA: Diagnosis not present

## 2022-01-27 DIAGNOSIS — R442 Other hallucinations: Secondary | ICD-10-CM | POA: Diagnosis not present

## 2022-01-27 DIAGNOSIS — G245 Blepharospasm: Secondary | ICD-10-CM | POA: Diagnosis not present

## 2022-01-27 DIAGNOSIS — R519 Headache, unspecified: Secondary | ICD-10-CM | POA: Diagnosis not present

## 2022-01-27 DIAGNOSIS — G932 Benign intracranial hypertension: Secondary | ICD-10-CM | POA: Diagnosis not present

## 2022-02-11 DIAGNOSIS — G932 Benign intracranial hypertension: Secondary | ICD-10-CM | POA: Diagnosis not present

## 2022-03-29 DIAGNOSIS — G43101 Migraine with aura, not intractable, with status migrainosus: Secondary | ICD-10-CM | POA: Diagnosis not present

## 2022-03-29 DIAGNOSIS — R442 Other hallucinations: Secondary | ICD-10-CM | POA: Diagnosis not present

## 2022-03-29 DIAGNOSIS — G932 Benign intracranial hypertension: Secondary | ICD-10-CM | POA: Diagnosis not present

## 2022-03-29 DIAGNOSIS — G245 Blepharospasm: Secondary | ICD-10-CM | POA: Diagnosis not present

## 2022-04-06 ENCOUNTER — Ambulatory Visit: Payer: Federal, State, Local not specified - PPO | Admitting: Sports Medicine

## 2022-04-06 ENCOUNTER — Ambulatory Visit (INDEPENDENT_AMBULATORY_CARE_PROVIDER_SITE_OTHER): Payer: Federal, State, Local not specified - PPO

## 2022-04-06 DIAGNOSIS — Z09 Encounter for follow-up examination after completed treatment for conditions other than malignant neoplasm: Secondary | ICD-10-CM | POA: Diagnosis not present

## 2022-04-06 DIAGNOSIS — M545 Low back pain, unspecified: Secondary | ICD-10-CM | POA: Diagnosis not present

## 2022-04-06 DIAGNOSIS — M5441 Lumbago with sciatica, right side: Secondary | ICD-10-CM | POA: Diagnosis not present

## 2022-04-06 DIAGNOSIS — M5442 Lumbago with sciatica, left side: Secondary | ICD-10-CM

## 2022-04-06 DIAGNOSIS — M25562 Pain in left knee: Secondary | ICD-10-CM | POA: Diagnosis not present

## 2022-04-06 DIAGNOSIS — Z043 Encounter for examination and observation following other accident: Secondary | ICD-10-CM | POA: Diagnosis not present

## 2022-04-06 DIAGNOSIS — W19XXXA Unspecified fall, initial encounter: Secondary | ICD-10-CM

## 2022-04-06 MED ORDER — PREDNISONE 50 MG PO TABS: ORAL_TABLET | ORAL | 0 refills | Status: DC

## 2022-04-06 NOTE — Progress Notes (Signed)
    Procedures performed today:    None.  Independent interpretation of notes and tests performed by another provider:   None.  Brief History, Exam, Impression, and Recommendations:    Acute low back pain with bilateral sciatica Recent fall a week and a half ago, increasing axial low back pain, mostly left-sided but she has had some bilateral sciatic symptoms. No red flag symptoms, adding x-rays, prednisone, home physical therapy, return to see me in 6 weeks, MR for interventional planning if no better. Of note her daughter is a Research scientist (life sciences).  Left anterior knee pain Also present for about a week and a half after a fall directly on the front of her knee, tenderness and swelling is directly over the tibial tuberosity, differential includes simple contusion, fracture of the tibial tuberosity and deep infrapatellar bursitis. Prednisone as above should help, adding x-rays, home conditioning, return to see me in 6 weeks, MRI if no better.    ____________________________________________ Gwen Her. Dianah Field, M.D., ABFM., CAQSM., AME. Primary Care and Sports Medicine [facility]  Adjunct Professor of [employer]  Risk manager

## 2022-04-06 NOTE — Assessment & Plan Note (Signed)
Recent fall a week and a half ago, increasing axial low back pain, mostly left-sided but she has had some bilateral sciatic symptoms. No red flag symptoms, adding x-rays, prednisone, home physical therapy, return to see me in 6 weeks, MR for interventional planning if no better. Of note her daughter is a Research scientist (life sciences).

## 2022-04-06 NOTE — Assessment & Plan Note (Signed)
Also present for about a week and a half after a fall directly on the front of her knee, tenderness and swelling is directly over the tibial tuberosity, differential includes simple contusion, fracture of the tibial tuberosity and deep infrapatellar bursitis. Prednisone as above should help, adding x-rays, home conditioning, return to see me in 6 weeks, MRI if no better.

## 2022-05-03 ENCOUNTER — Telehealth: Payer: Self-pay

## 2022-05-03 ENCOUNTER — Other Ambulatory Visit: Payer: Self-pay

## 2022-05-03 ENCOUNTER — Emergency Department (HOSPITAL_COMMUNITY)
Admission: EM | Admit: 2022-05-03 | Discharge: 2022-05-03 | Disposition: A | Discharge status: Home / Self Care | Payer: Federal, State, Local not specified - PPO | Attending: Emergency Medicine | Admitting: Emergency Medicine

## 2022-05-03 ENCOUNTER — Encounter (HOSPITAL_COMMUNITY): Payer: Self-pay

## 2022-05-03 ENCOUNTER — Emergency Department (HOSPITAL_COMMUNITY): Payer: Federal, State, Local not specified - PPO

## 2022-05-03 DIAGNOSIS — Z7982 Long term (current) use of aspirin: Secondary | ICD-10-CM | POA: Diagnosis not present

## 2022-05-03 DIAGNOSIS — R002 Palpitations: Secondary | ICD-10-CM | POA: Diagnosis not present

## 2022-05-03 DIAGNOSIS — R0602 Shortness of breath: Secondary | ICD-10-CM | POA: Diagnosis not present

## 2022-05-03 LAB — BASIC METABOLIC PANEL
Anion gap: 9 (ref 5–15)
BUN: 10 mg/dL (ref 6–20)
CO2: 21 mmol/L — ABNORMAL LOW (ref 22–32)
Calcium: 9 mg/dL (ref 8.9–10.3)
Chloride: 105 mmol/L (ref 98–111)
Creatinine, Ser: 0.91 mg/dL (ref 0.44–1.00)
GFR, Estimated: >60 mL/min (ref >60)
Glucose, Bld: 77 mg/dL (ref 70–99)
Potassium: 4 mmol/L (ref 3.5–5.1)
Sodium: 135 mmol/L (ref 135–145)

## 2022-05-03 LAB — CBC
HCT: 42 % (ref 36.0–46.0)
Hemoglobin: 13.9 g/dL (ref 12.0–15.0)
MCH: 28.4 pg (ref 26.0–34.0)
MCHC: 33.1 g/dL (ref 30.0–36.0)
MCV: 85.9 fL (ref 80.0–100.0)
Platelets: 308 10*3/uL (ref 150–400)
RBC: 4.89 MIL/uL (ref 3.87–5.11)
RDW: 13.7 % (ref 11.5–15.5)
WBC: 11.5 10*3/uL — ABNORMAL HIGH (ref 4.0–10.5)
nRBC: 0 % (ref 0.0–0.2)

## 2022-05-03 LAB — I-STAT BETA HCG BLOOD, ED (MC, WL, AP ONLY): I-stat hCG, quantitative: <5 m[IU]/mL (ref <5)

## 2022-05-03 LAB — TROPONIN I (HIGH SENSITIVITY)
Troponin I (High Sensitivity): 6 ng/L (ref <18)
Troponin I (High Sensitivity): 10 ng/L (ref <18)

## 2022-05-03 NOTE — ED Triage Notes (Signed)
Reports palpitations x 3 days intermittently and sob during the episodes.

## 2022-05-03 NOTE — Discharge Instructions (Signed)
Thank you for allowing me to be part of your care today.  I have placed an internal referral for cardiology.  They should call you to schedule an appointment, however, if you have not heard from them by the end of the week, please call the number provided.   I also recommend following up with your primary care provider as needed.   Return to the ED if you have worsening of your symptoms or if you have any new concerns.

## 2022-05-03 NOTE — ED Provider Notes (Signed)
[facility] Provider Note   CSN: [id number] Arrival date & time: 05/03/22  1140     History  Chief Complaint  Patient presents with   Palpitations   Shortness of Breath    Kirsten Cooper is a 42 y.o. female with PMH significant for IIH presents to the ED complaining of intermittent palpitations for the past 4 days.  She reports that she will get a random feeling in her chest like a small beat, then a big squeeze that is uncomfortable.  During these brief episodes she also has mild shortness of breath that quickly resolves.  Patient denies use of stimulants, nicotine, alcohol or illicit drugs. She consumes some caffeine, but states it is not excessive.  Denies fever, cough, chest tightness, nausea, vomiting, chest pain, leg swelling, dizziness, light-headedness, syncope, weakness.  Patient prescribed acetazolamide for IIH, but has not taken it as she prefers to try diet and exercise first, which she has been doing.        Home Medications Prior to Admission medications   Medication Sig Start Date End Date Taking? Authorizing Provider  acetaZOLAMIDE ER (DIAMOX) 500 MG capsule Take 500 mg by mouth 2 (two) times daily. 11/04/21   [provider]  albuterol (VENTOLIN HFA) 108 (90 Base) MCG/ACT inhaler Inhale 2 puffs into the lungs every 6 (six) hours as needed for wheezing. 10/29/21   Samuel Bouche, NP  Ascorbic Acid (VITAMIN C) 100 MG tablet Take 100 mg by mouth daily.    [provider]  aspirin 81 MG chewable tablet Chew 81 mg by mouth daily. Optometrist recommended daily 81 mg aspirin for headache prevention.    [provider]  cholecalciferol (VITAMIN D3) 25 MCG (1000 UNIT) tablet Take 1,000 Units by mouth daily.    [provider]  fish oil-omega-3 fatty acids 1000 MG capsule Take 2 g by mouth daily.    [provider]  Methylcobalamin (B12-ACTIVE PO) Take by mouth.    [provider]   Multiple Vitamin (MULTIVITAMIN) tablet Take 1 tablet by mouth daily.    [provider]  predniSONE (DELTASONE) 50 MG tablet One tab PO daily for 5 days. 04/06/22   Silverio Decamp, MD      Allergies    Patient has no known allergies.    Review of Systems   Review of Systems  Constitutional:  Negative for fever.  Respiratory:  Positive for shortness of breath. Negative for cough and chest tightness.   Cardiovascular:  Positive for palpitations. Negative for chest pain and leg swelling.  Gastrointestinal:  Negative for nausea and vomiting.  Neurological:  Negative for dizziness, syncope, weakness and light-headedness.    Physical Exam Updated Vital Signs BP 123/89   Pulse 71   Temp 98.1 F (36.7 C) (Oral)   Resp 18   Ht 5' 2.5" (1.588 m)   Wt 87.1 kg   LMP 03/08/2022 (Approximate)   SpO2 100%   BMI 34.56 kg/m  Physical Exam Vitals and nursing note reviewed.  Constitutional:      General: She is not in acute distress.    Appearance: She is not ill-appearing.  HENT:     Mouth/Throat:     Mouth: Mucous membranes are moist.     Pharynx: Oropharynx is clear.  Cardiovascular:     Rate and Rhythm: Normal rate and regular rhythm.     Pulses: Normal pulses.     Heart sounds: Normal heart sounds.  Pulmonary:  Effort: Pulmonary effort is normal. No respiratory distress.     Breath sounds: Normal breath sounds and air entry.  Abdominal:     General: Abdomen is flat. Bowel sounds are normal. There is no distension.     Palpations: Abdomen is soft.     Tenderness: There is no abdominal tenderness.  Skin:    General: Skin is warm and dry.     Capillary Refill: Capillary refill takes less than 2 seconds.  Neurological:     Mental Status: She is alert. Mental status is at baseline.  Psychiatric:        Mood and Affect: Mood normal.        Behavior: Behavior normal.     ED Results / Procedures / Treatments   Labs (all labs ordered are listed, but only  abnormal results are displayed) Labs Reviewed  BASIC METABOLIC PANEL - Abnormal; Notable for the following components:      Result Value   CO2 21 (*)    All other components within normal limits  CBC - Abnormal; Notable for the following components:   WBC 11.5 (*)    All other components within normal limits  I-STAT BETA HCG BLOOD, ED ([facility], [facility], [facility] ONLY)  TROPONIN I (HIGH SENSITIVITY)  TROPONIN I (HIGH SENSITIVITY)    EKG EKG Interpretation  Date/Time:  Tuesday May 03 2022 11:49:55 EST Ventricular Rate:  79 PR Interval:  132 QRS Duration: 78 QT Interval:  352 QTC Calculation: 403 R Axis:   12 Text Interpretation: Normal sinus rhythm Minimal voltage criteria for LVH, may be normal variant ( R in aVL ) Borderline ECG When compared with ECG of 11-Jul-2019 14:14, PREVIOUS ECG IS PRESENT Confirmed by Margaretmary Eddy [phone]) on 05/03/2022 12:42:36 PM  Radiology DG Chest 2 View  Result Date: 05/03/2022 CLINICAL DATA:  sob EXAM: CHEST - 2 VIEW COMPARISON:  09/09/2018 FINDINGS: Cardiac silhouette is unremarkable. No pneumothorax or pleural effusion. The lungs are clear. The visualized skeletal structures are unremarkable. IMPRESSION: No acute cardiopulmonary process. Electronically Signed   By: Sammie Bench M.D.   On: 05/03/2022 12:35    Procedures Procedures    Medications Ordered in ED Medications - No data to display  ED Course/ Medical Decision Making/ A&P                             Medical Decision Making Amount and/or Complexity of Data Reviewed Labs: ordered. Radiology: ordered.   This patient presents to the ED with chief complaint(s) of palpitations with pertinent past medical history of IIH.  The complaint involves an extensive differential diagnosis and also carries with it a high risk of complications and morbidity.    The differential diagnosis includes ACS, AV block, PACs, PJCs, PVCs, wandering atrial pacemaker, anxiety, caffeine, adverse effect of  medication, anemia  The initial plan is to obtain baseline labs, including troponin, chest x-ray and ECG  Additional history obtained: Records reviewed  neurology records regarding patients IIH/pseudotumor cerebri  Initial Assessment:   On exam, patient is overall well appearing and in no acute distress.  Heart rate is normal in the 60s with regular rhythm.  No appreciated cardiac dysrhythmia during exam.  No appreciable murmurs.  Lungs clear to auscultation bilaterally.  Skin is warm and dry.  Abdomen soft and non tender.    Independent ECG/labs interpretation:  The following labs were independently interpreted:  CBC with mild leukocytosis, patient not complaining of fever  or other signs of infection.  Metabolic panel without electrolyte disturbance.  Renal function is normal.  Troponin normal.    Independent visualization and interpretation of imaging: I independently visualized the following imaging with scope of interpretation limited to determining acute life threatening conditions related to emergency care: chest x-ray, which revealed no evidence of pleural effusion, pneumothorax, or infiltrate to suggest edema or pneumonia.  Heart is of normal size.  I agree with radiologist interpretation.   Disposition:   Patient's work-up has been overall reassuring.  Will place ambulatory referral to cardiology for patient to follow-up outpatient.  She likely will need to wear a heart monitor to try and capture possible cardiac dysrhythmia.   The patient has been appropriately medically screened and/or stabilized in the ED. I have low suspicion for any other emergent medical condition which would require further screening, evaluation or treatment in the ED or require inpatient management. At time of discharge the patient is hemodynamically stable and in no acute distress. I have discussed work-up results and diagnosis with patient and answered all questions. Patient is agreeable with discharge plan. We  discussed strict return precautions for returning to the emergency department and they verbalized understanding.            Final Clinical Impression(s) / ED Diagnoses Final diagnoses:  Palpitations    Rx / DC Orders ED Discharge Orders          Ordered    Ambulatory referral to Cardiology       Comments: If you have not heard from the Cardiology office within the next 72 hours please call [phone].   05/03/22 1456              Pat Kocher, PA 05/03/22 1532    Dorie Rank, MD 05/04/22 [phone]

## 2022-05-03 NOTE — Telephone Encounter (Signed)
Patient called states she is having what feels like chest/heart pumping feels strained  with SOB and chest discomfort- lasting only a  few seconds for the past 3 to 4 days - happens greater than 10 times per day .   Spoke with Samuel Bouche, NP who recommends patient go to ER .   Patient informed to go to ER and is agreeable. Kirsten Cooper

## 2022-05-13 ENCOUNTER — Ambulatory Visit: Payer: Federal, State, Local not specified - PPO | Admitting: Medical-Surgical

## 2022-05-13 ENCOUNTER — Ambulatory Visit: Payer: Federal, State, Local not specified - PPO | Attending: Medical-Surgical

## 2022-05-13 ENCOUNTER — Encounter: Payer: Self-pay | Admitting: Medical-Surgical

## 2022-05-13 VITALS — BP 123/85 | HR 71 | Resp 20 | Ht 62.5 in | Wt 187.8 lb

## 2022-05-13 DIAGNOSIS — R42 Dizziness and giddiness: Secondary | ICD-10-CM | POA: Diagnosis not present

## 2022-05-13 DIAGNOSIS — R002 Palpitations: Secondary | ICD-10-CM

## 2022-05-13 MED ORDER — MECLIZINE HCL 25 MG PO TABS: 25.0000 mg | ORAL_TABLET | Freq: Three times a day (TID) | ORAL | 0 refills | Status: DC | PRN

## 2022-05-13 NOTE — Progress Notes (Signed)
Established Patient Office Visit  Subjective   Patient ID: Kirsten Cooper, female   DOB: October 08, 1980 Age: 42 y.o. MRN: [id number]   Chief Complaint  Patient presents with   Hospitalization Follow-up   HPI Kirsten Cooper 41 year old female presenting today for a hospital discharge follow-up.  She was seen in the ED on 05/03/2022 with reports of increasing palpitations.  She had been experiencing worsening palpitations for approximately 4 days before going to the ED.  Notes that the palpitations feel as if her heart thumps really hard.  She experiences a brief sensation of shortness of breath when this occurs.  Her workup at the emergency room was reassuring with no abnormalities found to explain her symptoms.  She received a referral from cardiology and reports that she has an appointment with them next week.  Today, she reports that she started having some dizziness about an hour prior to her appointment.  She feels like she is off balance rather than the room spinning.  This is not accompanied by nausea, hearing changes, ear pain, vision changes, weakness, difficulty speaking, paresthesias, or headache.  Endorses a history of vertigo and notes that the symptoms are quite similar.  She did drive herself to the appointment today however reports that she has someone who can come get her for safety reasons.   Objective:    Vitals:   05/13/22 1350  BP: 123/85  Pulse: 71  Resp: 20  Height: 5' 2.5" (1.588 m)  Weight: 187 lb 12.8 oz (85.2 kg)  SpO2: 100%  BMI (Calculated): 33.78   Physical Exam Vitals reviewed.  Constitutional:      General: She is not in acute distress.    Appearance: Normal appearance. She is not ill-appearing.  HENT:     Head: Normocephalic and atraumatic.  Cardiovascular:     Rate and Rhythm: Normal rate and regular rhythm.     Pulses: Normal pulses.     Heart sounds: Normal heart sounds.  Pulmonary:     Effort: Pulmonary effort is normal. No respiratory distress.      Breath sounds: Normal breath sounds. No wheezing, rhonchi or rales.  Skin:    General: Skin is warm and dry.  Neurological:     Mental Status: She is alert and oriented to person, place, and time.  Psychiatric:        Mood and Affect: Mood normal.        Behavior: Behavior normal.        Thought Content: Thought content normal.        Judgment: Judgment normal.   No results found for this or any previous visit (from the past 24 hour(s)).     The 10-year ASCVD risk score (Arnett DK, et al., 2019) is: 0.7%   Values used to calculate the score:     Age: 56 years     Sex: Female     Is Non-Hispanic African American: Yes     Diabetic: No     Tobacco smoker: No     Systolic Blood Pressure: [id number]     Is BP treated: No     HDL Cholesterol: 54 mg/dL     Total Cholesterol: 204 mg/dL   Assessment & Plan:   1. Palpitations 2. Dizziness Reviewed labs and testing completed in the hospital.  Notable for elevated Stick blood cell count which seems to be chronic.  Rechecking CBC today.  Checking TSH and magnesium as these were not done in the ED.  Although she  has cardiology coming up, would like to order a long-term monitor for better information.  Since she is having dizziness, would like to also order a carotid ultrasound for further investigation.  Urged to continue with plan to see cardiology as scheduled. - CBC with Differential/Platelet - TSH - Magnesium - LONG TERM MONITOR (3-14 DAYS); Future - US Carotid Bilateral; Future  3. Vertigo Symptoms are consistent with vertigo and seem to be exacerbated by position changes and head movement.  Adding meclizine 25 mg 3 times daily.  Strongly recommend she have someone drive her home rather than attempting to get home herself.  Patient verbalized understanding and is agreeable to the plan.  Return if symptoms worsen or fail to improve.  ___________________________________________ Clearnce Sorrel, DNP, APRN, FNP-BC Primary Care and [place]

## 2022-05-13 NOTE — Progress Notes (Unsigned)
Enrolled patient for a 14 day Zio XT monitor to be mailed to patients home   DOD to read

## 2022-05-14 LAB — CBC WITH DIFFERENTIAL/PLATELET
Absolute Monocytes: 731 cells/uL (ref 200–950)
Basophils Absolute: 52 cells/uL (ref 0–200)
Basophils Relative: 0.5 %
Eosinophils Absolute: 155 cells/uL (ref 15–500)
Eosinophils Relative: 1.5 %
HCT: 39.2 % (ref 35.0–45.0)
Hemoglobin: 13 g/dL (ref 11.7–15.5)
Lymphs Abs: 2441 cells/uL (ref 850–3900)
MCH: 27.6 pg (ref 27.0–33.0)
MCHC: 33.2 g/dL (ref 32.0–36.0)
MCV: 83.2 fL (ref 80.0–100.0)
MPV: 11.2 fL (ref 7.5–12.5)
Monocytes Relative: 7.1 %
Neutro Abs: 6922 cells/uL (ref 1500–7800)
Neutrophils Relative %: 67.2 %
Platelets: 351 10*3/uL (ref 140–400)
RBC: 4.71 10*6/uL (ref 3.80–5.10)
RDW: 13.1 % (ref 11.0–15.0)
Total Lymphocyte: 23.7 %
WBC: 10.3 10*3/uL (ref 3.8–10.8)

## 2022-05-14 LAB — TSH: TSH: 1.68 mIU/L

## 2022-05-14 LAB — MAGNESIUM: Magnesium: 2 mg/dL (ref 1.5–2.5)

## 2022-05-15 NOTE — Progress Notes (Unsigned)
Cardiology Office Note:   Date:  05/16/2022  NAME:  Kirsten Cooper    MRN: [id number] DOB:  1981-03-02   PCP:  Samuel Bouche, NP  Cardiologist:  None  Electrophysiologist:  None   Referring MD: Samuel Bouche, NP   Chief Complaint  Patient presents with   Palpitations        History of Present Illness:   Kirsten Cooper is a 42 y.o. female with a hx of GERD who is being seen today for the evaluation of dizziness/palpitations at the request of Samuel Bouche, NP.  She reports that she was seen in the emergency room on 05/03/2022.  She was having episodes of palpitations.  Apparently 5 days prior to being seen in the emergency room she was having rapid heartbeat sensation.  Symptoms were occurring daily.  They were last several times per hour.  They would last seconds.  She reports no identifiable trigger.  She reports no alleviating factor.  Symptoms would last very briefly.  She does report some abdominal bloating.  She reports when they did happen she would get short of breath.  She reports it would catch her breath.  She reports no chest pain or pressure.  She is really without medical problems.  Workup in the emergency room was negative.  High-sensitivity troponins were negative.  EKG is normal in office today.  Chest x-ray clear.  Exam very normal.  She does not smoke.  She does not drink alcohol in excess.  No drug use.  She does have idiopathic intracranial hypertension.  This is being managed conservatively.  No recent change in medications.  No fevers or chills.  Recent thyroid studies were normal.  Recent hemoglobin values are normal.  No strong family history of heart disease.  She reports no recent change in medications.  No change in stress level.  She is currently out of work.  She is staying at home.  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  She reports her primary care physician was concerned about wheezing.  An inhaler was prescribed.  She reports she has drinking 6 glasses of  water per day.  No excess caffeine.  TSH 1.68 HGB 13.9  Problem List Idiopathic intracranial HTN  Past Medical History: Past Medical History:  Diagnosis Date   Exertional headache 04/18/2018   GERD with esophagitis 09/09/2018    Past Surgical History: Past Surgical History:  Procedure Laterality Date   CESAREAN SECTION     DILATION AND EVACUATION N/A 06/25/2021   Procedure: DILATATION AND EVACUATION;  Surgeon: Woodroe Mode, MD;  Location: [facility];  Service: Gynecology;  Laterality: N/A;    Current Medications: Current Meds  Medication Sig   albuterol (VENTOLIN HFA) 108 (90 Base) MCG/ACT inhaler Inhale 2 puffs into the lungs every 6 (six) hours as needed for wheezing.   Ascorbic Acid (VITAMIN C) 100 MG tablet Take 100 mg by mouth daily.   cholecalciferol (VITAMIN D3) 25 MCG (1000 UNIT) tablet Take 1,000 Units by mouth daily.   Multiple Vitamin (MULTIVITAMIN) tablet Take 1 tablet by mouth daily.     Allergies:    Patient has no known allergies.   Social History: Social History   Socioeconomic History   Marital status: Married    Spouse name: Not on file   Number of children: 2   Years of education: 16   Highest education level: Bachelor's degree (e.g., BA, AB, BS)  Occupational History   Occupation: Surveyor, minerals  Employer: [employer]   Occupation: Social Work  Tobacco Use   Smoking status: Never    Passive exposure: Past   Smokeless tobacco: Never  Vaping Use   Vaping Use: Never used  Substance and Sexual Activity   Alcohol use: Not Currently   Drug use: Never   Sexual activity: Yes    Partners: Male    Birth control/protection: None  Other Topics Concern   Not on file  Social History Narrative   Not on file   Social Determinants of Health   Financial Resource Strain: Not on file  Food Insecurity: No Food Insecurity (06/22/2021)   Hunger Vital Sign    Worried About Running Out of Food in the Last Year: Never true    Ran Out of Food in the Last Year:  Never true  Transportation Needs: No Transportation Needs (06/22/2021)   PRAPARE - Hydrologist (Medical): No    Lack of Transportation (Non-Medical): No  Physical Activity: Not on file  Stress: Not on file  Social Connections: Not on file     Family History: The patient's family history includes Alzheimer's disease in her paternal grandmother; COPD in her mother.  ROS:   All other ROS reviewed and negative. Pertinent positives noted in the HPI.     EKGs/Labs/Other Studies Reviewed:   The following studies were personally reviewed by me today:  EKG:  EKG is ordered today.  The ekg ordered today demonstrates NSR 72 bpm, no acute changes, and was personally reviewed by me.   Recent Labs: 05/03/2022: BUN 10; Creatinine, Ser 0.91; Potassium 4.0; Sodium 135 05/13/2022: Hemoglobin 13.0; Magnesium 2.0; Platelets 351; TSH 1.68   Recent Lipid Panel    Component Value Date/Time   CHOL 204 (H) 04/02/2021 0000   TRIG 69 04/02/2021 0000   HDL 54 04/02/2021 0000   CHOLHDL 3.8 04/02/2021 0000   LDLCALC 134 (H) 04/02/2021 0000    Physical Exam:   VS:  BP 110/70 (BP Location: Left Arm, Patient Position: Sitting, Cuff Size: Normal)   Pulse 72   Ht '5\' 2"'$  (1.575 m)   Wt 185 lb 3.2 oz (84 kg)   SpO2 97%   BMI 33.87 kg/m    Wt Readings from Last 3 Encounters:  05/16/22 185 lb 3.2 oz (84 kg)  05/13/22 187 lb 12.8 oz (85.2 kg)  05/03/22 192 lb (87.1 kg)    General: Well nourished, well developed, in no acute distress Head: Atraumatic, normal size  Eyes: PEERLA, EOMI  Neck: Supple, no JVD Endocrine: No thryomegaly Cardiac: Normal S1, S2; RRR; no murmurs, rubs, or gallops Lungs: Clear to auscultation bilaterally, no wheezing, rhonchi or rales  Abd: Soft, nontender, no hepatomegaly  Ext: No edema, pulses 2+ Musculoskeletal: No deformities, BUE and BLE strength normal and equal Skin: Warm and dry, no rashes   Neuro: Alert and oriented to person, place, time, and  situation, CNII-XII grossly intact, no focal deficits  Psych: Normal mood and affect   ASSESSMENT:   Kirsten Cooper is a 42 y.o. female who presents for the following: 1. Palpitations   2. Dizziness     PLAN:   1. Palpitations 2. Dizziness -Reports symptoms of palpitations.  They are occurring daily.  Recent thyroid studies normal.  Recent hemoglobin is normal.  CV exam normal.  EKG normal.  Symptoms could be arrhythmia related.  Primary care physician has ordered a 14-day monitor.  I agree with this.  No need for an echo at  this time given normal EKG and normal examination.  In the interim I recommended regular exercise, increase water intake proper diet and a good night sleep.  She will see [place] back as needed based on the results of her monitor.  Disposition: Return if symptoms worsen or fail to improve.  Medication Adjustments/Labs and Tests Ordered: Current medicines are reviewed at length with the patient today.  Concerns regarding medicines are outlined above.  Orders Placed This Encounter  Procedures   EKG 12-Lead   No orders of the defined types were placed in this encounter.   Patient Instructions  Medication Instructions:  The current medical regimen is effective;  continue present plan and medications.  *If you need a refill on your cardiac medications before your next appointment, please call your pharmacy*   Follow-Up: At [facility], you and your health needs are our priority.  As part of our continuing mission to provide you with exceptional heart care, we have created designated Provider Care Teams.  These Care Teams include your primary Cardiologist (physician) and Advanced Practice Providers (APPs -  Physician Assistants and Nurse Practitioners) who all work together to provide you with the care you need, when you need it.  We recommend signing up for the patient portal called "MyChart".  Sign up information is provided on this After Visit Summary.   MyChart is used to connect with patients for Virtual Visits (Telemedicine).  Patients are able to view lab/test results, encounter notes, upcoming appointments, etc.  Non-urgent messages can be sent to your provider as well.   To learn more about what you can do with MyChart, go to [email or web address].    Your next appointment:   As needed   Provider:   Eleonore Chiquito, MD     Signed, Addison Naegeli. Audie Box, MD, [facility]  [address] [place], [place] [address] [phone]  05/16/2022 9:28 AM

## 2022-05-16 ENCOUNTER — Ambulatory Visit: Payer: Federal, State, Local not specified - PPO | Attending: Cardiovascular Disease | Admitting: Cardiovascular Disease

## 2022-05-16 ENCOUNTER — Encounter: Payer: Self-pay | Admitting: Cardiovascular Disease

## 2022-05-16 VITALS — BP 110/70 | HR 72 | Ht 62.0 in | Wt 185.2 lb

## 2022-05-16 DIAGNOSIS — R42 Dizziness and giddiness: Secondary | ICD-10-CM | POA: Diagnosis not present

## 2022-05-16 DIAGNOSIS — R002 Palpitations: Secondary | ICD-10-CM | POA: Diagnosis not present

## 2022-05-16 NOTE — Patient Instructions (Signed)
Medication Instructions:  The current medical regimen is effective;  continue present plan and medications.  *If you need a refill on your cardiac medications before your next appointment, please call your pharmacy*   Follow-Up: At [facility], you and your health needs are our priority.  As part of our continuing mission to provide you with exceptional heart care, we have created designated Provider Care Teams.  These Care Teams include your primary Cardiologist (physician) and Advanced Practice Providers (APPs -  Physician Assistants and Nurse Practitioners) who all work together to provide you with the care you need, when you need it.  We recommend signing up for the patient portal called "MyChart".  Sign up information is provided on this After Visit Summary.  MyChart is used to connect with patients for Virtual Visits (Telemedicine).  Patients are able to view lab/test results, encounter notes, upcoming appointments, etc.  Non-urgent messages can be sent to your provider as well.   To learn more about what you can do with MyChart, go to [email or web address].    Your next appointment:   As needed   Provider:   Eleonore Chiquito, MD

## 2022-05-17 DIAGNOSIS — G932 Benign intracranial hypertension: Secondary | ICD-10-CM | POA: Diagnosis not present

## 2022-05-17 DIAGNOSIS — G245 Blepharospasm: Secondary | ICD-10-CM | POA: Diagnosis not present

## 2022-05-17 DIAGNOSIS — R442 Other hallucinations: Secondary | ICD-10-CM | POA: Diagnosis not present

## 2022-05-17 DIAGNOSIS — R413 Other amnesia: Secondary | ICD-10-CM | POA: Diagnosis not present

## 2022-05-18 ENCOUNTER — Ambulatory Visit: Payer: Federal, State, Local not specified - PPO | Admitting: Sports Medicine

## 2022-05-22 DIAGNOSIS — R002 Palpitations: Secondary | ICD-10-CM

## 2022-05-22 DIAGNOSIS — R42 Dizziness and giddiness: Secondary | ICD-10-CM | POA: Diagnosis not present

## 2022-05-23 ENCOUNTER — Ambulatory Visit (INDEPENDENT_AMBULATORY_CARE_PROVIDER_SITE_OTHER): Payer: Federal, State, Local not specified - PPO | Admitting: Medical-Surgical

## 2022-05-23 ENCOUNTER — Encounter: Payer: Self-pay | Admitting: Medical-Surgical

## 2022-05-23 VITALS — BP 122/82 | HR 67 | Resp 20 | Ht 62.0 in | Wt 187.2 lb

## 2022-05-23 DIAGNOSIS — Z0000 Encounter for general adult medical examination without abnormal findings: Secondary | ICD-10-CM

## 2022-05-23 NOTE — Progress Notes (Signed)
Complete physical exam  Patient: Kirsten Cooper   DOB: April 18, 1980   42 y.o. Female  MRN: [id number]  Subjective:    Chief Complaint  Patient presents with   Annual Exam    Kirsten Cooper is a 42 y.o. female who presents today for a complete physical exam. She reports consuming a general diet. Home exercise routine includes cardio and weights. She generally feels well. She reports sleeping well. She does not have additional problems to discuss today.    Most recent fall risk assessment:    05/23/2022    9:17 AM  [place] in the past year? 0  Number falls in past yr: 0  Injury with Fall? 0  Risk for fall due to : No Fall Risks  Follow up Falls evaluation completed     Most recent depression screenings:    05/23/2022    9:17 AM 05/13/2022    1:52 PM  PHQ 2/9 Scores  PHQ - 2 Score 0 0    Vision:Within last year, Dental: No current dental problems and Receives regular dental care, and STD: The patient reports a past history of: herpes    Patient Care Team: Samuel Bouche, NP as PCP - General (Nurse Practitioner) Cameron Sprang, MD as Consulting Physician (Neurology)   Outpatient Medications Prior to Visit  Medication Sig   albuterol (VENTOLIN HFA) 108 (90 Base) MCG/ACT inhaler Inhale 2 puffs into the lungs every 6 (six) hours as needed for wheezing.   Ascorbic Acid (VITAMIN C) 100 MG tablet Take 100 mg by mouth daily.   cholecalciferol (VITAMIN D3) 25 MCG (1000 UNIT) tablet Take 1,000 Units by mouth daily.   Multiple Vitamin (MULTIVITAMIN) tablet Take 1 tablet by mouth daily.   [DISCONTINUED] meclizine (ANTIVERT) 25 MG tablet Take 1 tablet (25 mg total) by mouth 3 (three) times daily as needed for dizziness. (Patient not taking: Reported on 05/16/2022)   No facility-administered medications prior to visit.    Review of Systems  Constitutional:  Negative for chills, fever, malaise/fatigue and weight loss.  HENT:  Negative for congestion, ear pain, hearing loss,  sinus pain and sore throat.   Eyes:  Negative for blurred vision, photophobia and pain.  Respiratory:  Negative for cough, shortness of breath and wheezing.   Cardiovascular:  Positive for palpitations. Negative for chest pain and leg swelling.  Gastrointestinal:  Negative for abdominal pain, constipation, diarrhea, heartburn, nausea and vomiting.  Genitourinary:  Negative for dysuria, frequency and urgency.  Musculoskeletal:  Negative for falls and neck pain.  Skin:  Negative for itching and rash.  Neurological:  Positive for dizziness and headaches. Negative for weakness.  Endo/Heme/Allergies:  Negative for polydipsia. Does not bruise/bleed easily.  Psychiatric/Behavioral:  Negative for depression, substance abuse and suicidal ideas. The patient is not nervous/anxious and does not have insomnia.      Objective:    BP 122/82 (BP Location: Right Arm, Cuff Size: Normal)   Pulse 67   Resp 20   Ht '5\' 2"'$  (1.575 m)   Wt 187 lb 3.2 oz (84.9 kg)   SpO2 99%   BMI 34.24 kg/m    Physical Exam Constitutional:      General: She is not in acute distress.    Appearance: Normal appearance. She is not ill-appearing.  HENT:     Head: Normocephalic and atraumatic.     Right Ear: Tympanic membrane, ear canal and external ear normal. There is no impacted cerumen.     Left  Ear: Tympanic membrane, ear canal and external ear normal. There is no impacted cerumen.     Nose: Nose normal. No congestion or rhinorrhea.     Mouth/Throat:     Mouth: Mucous membranes are moist.     Pharynx: No oropharyngeal exudate or posterior oropharyngeal erythema.  Eyes:     General: No scleral icterus.       Right eye: No discharge.        Left eye: No discharge.     Extraocular Movements: Extraocular movements intact.     Conjunctiva/sclera: Conjunctivae normal.     Pupils: Pupils are equal, round, and reactive to light.  Neck:     Thyroid: No thyromegaly.     Vascular: No carotid bruit or JVD.     Trachea:  Trachea normal.  Cardiovascular:     Rate and Rhythm: Normal rate and regular rhythm.     Pulses: Normal pulses.     Heart sounds: Normal heart sounds. No murmur heard.    No friction rub. No gallop.  Pulmonary:     Effort: Pulmonary effort is normal. No respiratory distress.     Breath sounds: Normal breath sounds. No wheezing.  Abdominal:     General: Bowel sounds are normal. There is no distension.     Palpations: Abdomen is soft.     Tenderness: There is no abdominal tenderness. There is no guarding.  Musculoskeletal:        General: Normal range of motion.     Cervical back: Normal range of motion and neck supple.  Lymphadenopathy:     Cervical: No cervical adenopathy.  Skin:    General: Skin is warm and dry.  Neurological:     Mental Status: She is alert and oriented to person, place, and time.     Cranial Nerves: No cranial nerve deficit.  Psychiatric:        Mood and Affect: Mood normal.        Behavior: Behavior normal.        Thought Content: Thought content normal.        Judgment: Judgment normal.   No results found for any visits on 05/23/22.     Assessment & Plan:    Routine Health Maintenance and Physical Exam  Immunization History  Administered Date(s) Administered   Moderna Sars-Covid-2 Vaccination 10/06/2019, 11/07/2019    Health Maintenance  Topic Date Due   Hepatitis C Screening  Never done   DTaP/Tdap/Td (1 - Tdap) Never done   COVID-19 Vaccine (3 - Moderna risk series) 05/29/2022 (Originally 12/05/2019)   INFLUENZA VACCINE  06/12/2022 (Originally 10/12/2021)   PAP SMEAR-Modifier  10/15/2022   HIV Screening  Completed   HPV VACCINES  Aged Out    Discussed health benefits of physical activity, and encouraged her to engage in regular exercise appropriate for her age and condition.  1. Annual physical exam Recent labs reviewed with no concerns indicating need for recheck.  Up-to-date on preventative care.  She is due for her Pap smear but we will  schedule this as a Pap only appointment at her convenience.  Wellness information provided with AVS.  Return in about 1 year (around 05/23/2023) for annual physical exam.   Samuel Bouche, NP

## 2022-06-17 DIAGNOSIS — G932 Benign intracranial hypertension: Secondary | ICD-10-CM | POA: Diagnosis not present

## 2022-06-20 ENCOUNTER — Ambulatory Visit: Payer: Federal, State, Local not specified - PPO | Admitting: Medical-Surgical

## 2022-06-20 ENCOUNTER — Encounter: Payer: Self-pay | Admitting: Medical-Surgical

## 2022-06-20 ENCOUNTER — Other Ambulatory Visit (HOSPITAL_COMMUNITY)
Admission: RE | Admit: 2022-06-20 | Discharge: 2022-06-20 | Disposition: A | Discharge status: Home / Self Care | Payer: Federal, State, Local not specified - PPO | Source: Ambulatory Visit | Attending: Medical-Surgical | Admitting: Medical-Surgical

## 2022-06-20 VITALS — BP 117/83 | HR 74 | Resp 20 | Ht 62.0 in | Wt 181.1 lb

## 2022-06-20 DIAGNOSIS — Z124 Encounter for screening for malignant neoplasm of cervix: Secondary | ICD-10-CM

## 2022-06-20 LAB — HM PAP SMEAR: HM Pap smear: NEGATIVE

## 2022-06-20 LAB — RESULTS CONSOLE HPV: CHL HPV: NEGATIVE

## 2022-06-20 NOTE — Progress Notes (Signed)
        Established patient visit  History, exam, impression, and plan:  1. Cervical cancer screening Last pap approximately 2 years ago per patient recollection, normal at that time.  Completing Pap smear today with HPV cotesting.  Denies need for STI testing or any concern for BV/yeast.  If normal, she will need her next Pap smear in 5 years per guidelines. - Cytology - PAP   Procedures performed this visit: None.  Return if symptoms worsen or fail to improve.  __________________________________ Thayer Ohm, DNP, APRN, FNP-BC Primary Care and Sports Medicine [facility]

## 2022-06-21 LAB — CYTOLOGY - PAP
Comment: NEGATIVE
Diagnosis: NEGATIVE
High risk HPV: NEGATIVE

## 2022-07-04 ENCOUNTER — Ambulatory Visit: Payer: Federal, State, Local not specified - PPO | Admitting: Family Medicine

## 2022-07-04 ENCOUNTER — Other Ambulatory Visit: Payer: Self-pay | Admitting: Family Medicine

## 2022-07-04 ENCOUNTER — Encounter: Payer: Self-pay | Admitting: Family Medicine

## 2022-07-04 VITALS — BP 108/84 | HR 80 | Temp 99.0°F | Ht 62.0 in | Wt 182.1 lb

## 2022-07-04 DIAGNOSIS — R8290 Unspecified abnormal findings in urine: Secondary | ICD-10-CM | POA: Insufficient documentation

## 2022-07-04 DIAGNOSIS — B3731 Acute candidiasis of vulva and vagina: Secondary | ICD-10-CM

## 2022-07-04 LAB — POCT URINALYSIS DIP (CLINITEK)
Bilirubin, UA: NEGATIVE
Blood, UA: NEGATIVE
Glucose, UA: NEGATIVE mg/dL
Ketones, POC UA: NEGATIVE mg/dL
Leukocytes, UA: NEGATIVE
Nitrite, UA: NEGATIVE
POC PROTEIN,UA: NEGATIVE
Spec Grav, UA: 1.02 (ref 1.010–1.025)
Urobilinogen, UA: 0.2 E.U./dL
pH, UA: 5.5 (ref 5.0–8.0)

## 2022-07-04 LAB — WET PREP FOR TRICH, YEAST, CLUE
MICRO NUMBER:: 14855584
Specimen Quality: ADEQUATE

## 2022-07-04 MED ORDER — CEPHALEXIN 500 MG PO CAPS
500.0000 mg | ORAL_CAPSULE | Freq: Two times a day (BID) | ORAL | 0 refills | Status: AC
Start: 2022-07-04 — End: 2022-07-09

## 2022-07-04 MED ORDER — FLUCONAZOLE 150 MG PO TABS
150.0000 mg | ORAL_TABLET | Freq: Once | ORAL | 0 refills | Status: AC
Start: 2022-07-04 — End: 2022-07-04

## 2022-07-04 MED ORDER — PHENAZOPYRIDINE HCL 200 MG PO TABS: 200.0000 mg | ORAL_TABLET | Freq: Three times a day (TID) | ORAL | 0 refills | Status: DC | PRN

## 2022-07-04 NOTE — Progress Notes (Signed)
Acute Office Visit  Subjective:     Patient ID: Kirsten Cooper, female    DOB: Sep 09, 1980, 42 y.o.   MRN: [id number]  Chief Complaint  Patient presents with   Pelvic Pain    Started 3 days ago. She complains of pain and pressure. She describes it as a "fullness". She complains of clear discharge with a mild odor. She denies back pain.     HPI Patient is in today for malodorous urine.  Patient says she has had about 3 days of fullness and pressure in her pelvic area as well.  She also admits to some incomplete emptying.  She denies any dysuria.  She is sexually active.  Denies any abnormal discharge.  Last menstrual period was last month.  Review of Systems  Constitutional:  Negative for chills and fever.  Respiratory:  Negative for cough and shortness of breath.   Cardiovascular:  Negative for chest pain.  Genitourinary:  Negative for dysuria, frequency and urgency.  Neurological:  Negative for headaches.        Objective:    BP 108/84   Pulse 80   Temp 99 F (37.2 C) (Oral)   Ht  (1.575 m)   Wt 182 lb 1.3 oz (82.6 kg)   SpO2 100%   Breastfeeding No   BMI 33.30 kg/m    Physical Exam Vitals reviewed.  Constitutional:      Appearance: She is well-developed.  HENT:     Head: Normocephalic and atraumatic.  Eyes:     Conjunctiva/sclera: Conjunctivae normal.  Cardiovascular:     Rate and Rhythm: Normal rate.  Pulmonary:     Effort: Pulmonary effort is normal.  Genitourinary:    Comments: Chaperone present.  And wet prep swab taken. Skin:    General: Skin is dry.     Coloration: Skin is not pale.  Neurological:     Mental Status: She is alert and oriented to person, place, and time.  Psychiatric:        Behavior: Behavior normal.     Results for orders placed or performed in visit on 07/04/22  POCT URINALYSIS DIP (CLINITEK)  Result Value Ref Range   Color, UA yellow yellow   Clarity, UA clear clear   Glucose, UA negative negative mg/dL   Bilirubin,  UA negative negative   Ketones, POC UA negative negative mg/dL   Spec Grav, UA 7.846 9.629 - 1.025   Blood, UA negative negative   pH, UA 5.5 5.0 - 8.0   POC PROTEIN,UA negative negative, trace   Urobilinogen, UA 0.2 0.2 or 1.0 E.U./dL   Nitrite, UA Negative Negative   Leukocytes, UA Negative Negative        Assessment & Plan:   Problem List Items Addressed This Visit       Other   Malodorous urine - Primary    Due to patient's symptoms of fullness and pressure along with history of sexual intercourse.  Will go ahead and empirically treat for UTI.  We did try and obtain a urine sample in clinic however patient was unable to void enough urine.  Wet prep also done in clinic to look for any abnormalities.  Go ahead and treat with Keflex as well as Pyridium. - UPT done and is negative.      Relevant Medications   phenazopyridine (PYRIDIUM) 200 MG tablet   cephALEXin (KEFLEX) 500 MG capsule   Other Relevant Orders   WET PREP FOR TRICH, YEAST, CLUE   POCT  URINALYSIS DIP (CLINITEK) (Completed)    Meds ordered this encounter  Medications   phenazopyridine (PYRIDIUM) 200 MG tablet    Sig: Take 1 tablet (200 mg total) by mouth 3 (three) times daily as needed for pain.    Dispense:  10 tablet    Refill:  0   cephALEXin (KEFLEX) 500 MG capsule    Sig: Take 1 capsule (500 mg total) by mouth 2 (two) times daily for 5 days.    Dispense:  10 capsule    Refill:  0    Return if symptoms worsen or fail to improve.  Charlton Amor, DO

## 2022-07-04 NOTE — Assessment & Plan Note (Addendum)
Due to patient's symptoms of fullness and pressure along with history of sexual intercourse.  Will go ahead and empirically treat for UTI.  We did try and obtain a urine sample in clinic however patient was unable to void enough urine.  Wet prep also done in clinic to look for any abnormalities.  Go ahead and treat with Keflex as well as Pyridium. - UPT done and is negative.

## 2022-08-02 DIAGNOSIS — G43101 Migraine with aura, not intractable, with status migrainosus: Secondary | ICD-10-CM | POA: Diagnosis not present

## 2022-08-02 DIAGNOSIS — G245 Blepharospasm: Secondary | ICD-10-CM | POA: Diagnosis not present

## 2022-08-02 DIAGNOSIS — G932 Benign intracranial hypertension: Secondary | ICD-10-CM | POA: Diagnosis not present

## 2022-08-02 DIAGNOSIS — R413 Other amnesia: Secondary | ICD-10-CM | POA: Diagnosis not present

## 2022-08-03 ENCOUNTER — Other Ambulatory Visit: Payer: Self-pay

## 2022-08-03 ENCOUNTER — Encounter (HOSPITAL_COMMUNITY): Payer: Self-pay

## 2022-08-03 ENCOUNTER — Emergency Department (HOSPITAL_COMMUNITY)
Admission: EM | Admit: 2022-08-03 | Discharge: 2022-08-03 | Disposition: A | Discharge status: Home / Self Care | Payer: Federal, State, Local not specified - PPO | Attending: Student | Admitting: Student

## 2022-08-03 DIAGNOSIS — G44209 Tension-type headache, unspecified, not intractable: Secondary | ICD-10-CM | POA: Insufficient documentation

## 2022-08-03 DIAGNOSIS — R519 Headache, unspecified: Secondary | ICD-10-CM | POA: Diagnosis not present

## 2022-08-03 LAB — CBC WITH DIFFERENTIAL/PLATELET
Abs Immature Granulocytes: 0.03 10*3/uL (ref 0.00–0.07)
Basophils Absolute: 0.1 10*3/uL (ref 0.0–0.1)
Basophils Relative: 1 %
Eosinophils Absolute: 0.1 10*3/uL (ref 0.0–0.5)
Eosinophils Relative: 1 %
HCT: 43.5 % (ref 36.0–46.0)
Hemoglobin: 13.3 g/dL (ref 12.0–15.0)
Immature Granulocytes: 0 %
Lymphocytes Relative: 21 %
Lymphs Abs: 2.4 10*3/uL (ref 0.7–4.0)
MCH: 27.4 pg (ref 26.0–34.0)
MCHC: 30.6 g/dL (ref 30.0–36.0)
MCV: 89.7 fL (ref 80.0–100.0)
Monocytes Absolute: 0.7 10*3/uL (ref 0.1–1.0)
Monocytes Relative: 6 %
Neutro Abs: 7.9 10*3/uL — ABNORMAL HIGH (ref 1.7–7.7)
Neutrophils Relative %: 71 %
Platelets: 294 10*3/uL (ref 150–400)
RBC: 4.85 MIL/uL (ref 3.87–5.11)
RDW: 13.5 % (ref 11.5–15.5)
WBC: 11.1 10*3/uL — ABNORMAL HIGH (ref 4.0–10.5)
nRBC: 0 % (ref 0.0–0.2)

## 2022-08-03 LAB — BASIC METABOLIC PANEL
Anion gap: 10 (ref 5–15)
BUN: 11 mg/dL (ref 6–20)
CO2: 21 mmol/L — ABNORMAL LOW (ref 22–32)
Calcium: 8.7 mg/dL — ABNORMAL LOW (ref 8.9–10.3)
Chloride: 106 mmol/L (ref 98–111)
Creatinine, Ser: 0.85 mg/dL (ref 0.44–1.00)
GFR, Estimated: >60 mL/min (ref >60)
Glucose, Bld: 84 mg/dL (ref 70–99)
Potassium: 4.4 mmol/L (ref 3.5–5.1)
Sodium: 137 mmol/L (ref 135–145)

## 2022-08-03 LAB — I-STAT BETA HCG BLOOD, ED (MC, WL, AP ONLY): I-stat hCG, quantitative: <5 m[IU]/mL (ref <5)

## 2022-08-03 MED ORDER — ACETAMINOPHEN 325 MG PO TABS
650.0000 mg | ORAL_TABLET | Freq: Once | ORAL | Status: AC
  Administered 2022-08-03: 650 mg via ORAL
  Filled 2022-08-03: qty 2

## 2022-08-03 MED ORDER — DEXAMETHASONE SODIUM PHOSPHATE 10 MG/ML IJ SOLN
10.0000 mg | Freq: Once | INTRAMUSCULAR | Status: AC
  Administered 2022-08-03: 10 mg via INTRAMUSCULAR
  Filled 2022-08-03: qty 1

## 2022-08-03 MED ORDER — DEXAMETHASONE SODIUM PHOSPHATE 10 MG/ML IJ SOLN: 10.0000 mg | Freq: Once | INTRAMUSCULAR | Status: DC

## 2022-08-03 MED ORDER — ACETAZOLAMIDE ER 500 MG PO CP12
500.0000 mg | ORAL_CAPSULE | Freq: Two times a day (BID) | ORAL | Status: DC
  Administered 2022-08-03: 500 mg via ORAL
  Filled 2022-08-03 (×2): qty 1

## 2022-08-03 NOTE — ED Provider Notes (Signed)
[place] EMERGENCY DEPARTMENT AT [facility] Provider Note   CSN: [id number] Arrival date & time: 08/03/22  1118     History  Chief Complaint  Patient presents with   Headache   Dizziness   Blurred Vision    Kirsten Cooper is a 42 y.o. female with PMHx IIH who presents to ED complaining of headache. Patient with headaches over the past 4-5 days and taking Motrin to help relieve headaches. Patient in class today when she experienced visual blurring, lightheadedness, generalized weakness along with her headache.  Denies syncope, fever, chills, cough, chest pain, dyspnea, nausea, vomiting, diarrhea, abdominal pain.   Headache Associated symptoms: dizziness   Dizziness Associated symptoms: headaches        Home Medications Prior to Admission medications   Medication Sig Start Date End Date Taking? Authorizing Provider  albuterol (VENTOLIN HFA) 108 (90 Base) MCG/ACT inhaler Inhale 2 puffs into the lungs every 6 (six) hours as needed for wheezing. 10/29/21   Christen Butter, NP  Ascorbic Acid (VITAMIN C) 100 MG tablet Take 100 mg by mouth daily.    [provider]  cholecalciferol (VITAMIN D3) 25 MCG (1000 UNIT) tablet Take 1,000 Units by mouth daily.    [provider]  Multiple Vitamin (MULTIVITAMIN) tablet Take 1 tablet by mouth daily.    [provider]  phenazopyridine (PYRIDIUM) 200 MG tablet Take 1 tablet (200 mg total) by mouth 3 (three) times daily as needed for pain. 07/04/22   Charlton Amor, DO      Allergies    Patient has no known allergies.    Review of Systems   Review of Systems  Neurological:  Positive for dizziness and headaches.    Physical Exam Updated Vital Signs BP (!) 133/96 (BP Location: Right Arm)   Pulse 71   Temp 98.6 F (37 C) (Oral)   Resp 17   SpO2 99%  Physical Exam Vitals and nursing note reviewed.  Constitutional:      General: She is not in acute distress.    Appearance: She is not ill-appearing  or toxic-appearing.  HENT:     Head: Normocephalic and atraumatic.     Mouth/Throat:     Mouth: Mucous membranes are moist.     Pharynx: No oropharyngeal exudate or posterior oropharyngeal erythema.  Eyes:     General: No scleral icterus.       Right eye: No discharge.        Left eye: No discharge.     Conjunctiva/sclera: Conjunctivae normal.  Cardiovascular:     Rate and Rhythm: Normal rate and regular rhythm.     Pulses: Normal pulses.     Heart sounds: Normal heart sounds. No murmur heard. Pulmonary:     Effort: Pulmonary effort is normal. No respiratory distress.     Breath sounds: Normal breath sounds. No wheezing, rhonchi or rales.  Abdominal:     Tenderness: There is no abdominal tenderness.  Musculoskeletal:     Right lower leg: No edema.     Left lower leg: No edema.  Skin:    General: Skin is warm and dry.     Findings: No rash.  Neurological:     General: No focal deficit present.     Mental Status: She is alert. Mental status is at baseline.     GCS: GCS eye subscore is 4. GCS verbal subscore is 5. GCS motor subscore is 6.     Cranial Nerves: No cranial nerve deficit  or dysarthria.     Sensory: No sensory deficit.     Motor: No weakness.     Coordination: Coordination normal.     Comments: GCS 15. Speech is goal oriented. No deficits appreciated to CN III-XII; symmetric eyebrow raise, no facial drooping, tongue midline. Patient has equal grip strength bilaterally with 5/5 strength against resistance in all major muscle groups bilaterally. Sensation to light touch intact. Patient moves extremities without ataxia. Normal finger-nose-finger. Patient ambulatory with steady gait.   Psychiatric:        Mood and Affect: Mood normal.        Behavior: Behavior normal.     ED Results / Procedures / Treatments   Labs (all labs ordered are listed, but only abnormal results are displayed) Labs Reviewed  CBC WITH DIFFERENTIAL/PLATELET - Abnormal; Notable for the following  components:      Result Value   WBC 11.1 (*)    Neutro Abs 7.9 (*)    All other components within normal limits  BASIC METABOLIC PANEL  I-STAT BETA HCG BLOOD, ED ([facility], [facility], [facility] ONLY)    EKG None  Radiology No results found.  Procedures Procedures    Medications Ordered in ED Medications  acetaZOLAMIDE ER (DIAMOX) 12 hr capsule 500 mg (has no administration in time range)  acetaminophen (TYLENOL) tablet 650 mg (650 mg Oral Given 08/03/22 1437)  dexamethasone (DECADRON) injection 10 mg (10 mg Intramuscular Given 08/03/22 1437)    ED Course/ Medical Decision Making/ A&P                             Medical Decision Making Risk OTC drugs. Prescription drug management.   This patient presents to the ED for concern of headache, this involves an extensive number of treatment options, and is a complaint that carries with it a high risk of complications and morbidity.  The differential diagnosis includes migraine, tension headache, cluster headache, subarachnoid hemorrhage, meningitis/encephalitis, acute angle closure glaucoma, giant cell arteritis, idiopathic intercranial hypertension, ischemic stroke, ICH, cervical artery dissection   Co morbidities that complicate the patient evaluation  IIH    Lab Tests:  I Ordered, and personally interpreted labs.  The pertinent results include:   -CBC: No concern for anemia or leukocytosis -BMP: pending -pregnancy: negative    Problem List / ED Course / Critical interventions / Medication management  Patient presents to ED with headache. Physical exam unremarkable. Neuro exam unremarkable. Patient afebrile with stable vitals. Blood work reassuring. Migraine cocktail relieved patient's symptoms I have reviewed the patients home medicines and have made adjustments as needed Patient was given return precautions. Patient stable for discharge at this time.  Patient verbalized understanding of plan.  Ddx: these are considered less likely  due to history of present illness and physical exam -subarachnoid hemorrhage: no neurodeficits, vomiting -meningitis/encephalitis: stable vital signs,  lack of meningismus symptoms -acute angle closure glaucoma: no eye pain/redness, vision loss, nausea, vomiting -giant cell arteritis: no temporal/jaw pain; fever, fatigue, weight loss, vision loss -idiopathic intercranial hypertension: no changes in vision, stiff neck, nausea -ischemic stroke/ICH/cervical artery dissection: no neurodeficits  4:31 PM Care of Kirsten Cooper transferred to PA sarah smoot  at the end of my shift as the patient will require reassessment once labs/imaging have resulted. Patient presentation, ED course, and plan of care discussed with review of all pertinent labs and imaging. Please see his/her note for further details regarding further ED course and disposition. Plan  at time of handoff is reassess patient after BMP. If normal, patient can be discharged.This may be altered or completely changed at the discretion of the oncoming team pending results of further workup.      Social Determinants of Health:  none           Final Clinical Impression(s) / ED Diagnoses Final diagnoses:  Acute non intractable tension-type headache    Rx / DC Orders ED Discharge Orders     None         Margarita Rana 08/03/22 1631    Glendora Score, MD 08/03/22 1810

## 2022-08-03 NOTE — Discharge Instructions (Addendum)
I am glad you are feeling better. Please remember to not take Motrin more than twice in one week for headaches. Lab work looks reassuring. I recommend taking medicine prescribed by your neurologist to help prevent future headaches.  Seek emergency care if experiencing any new or worsening symptoms such as loss of consciousness, severe headache, or vision changes

## 2022-08-03 NOTE — ED Provider Notes (Signed)
Care assumed from [facility], PA-C at shift change. Please see their note for further information.   Briefly: Patient with history of IIH presents with headache. She has been seen by neurology and formally diagnosed with IIH and placed on Diamox. She has not been taking her medications for this. Does endorse some blurred vision today. She is alert and oriented and neurologically intact without focal deficits.  Ddx: migraine, tension headache, cluster headache, subarachnoid hemorrhage, meningitis/encephalitis, acute angle closure glaucoma, giant cell arteritis, idiopathic intercranial hypertension, ischemic stroke, ICH, cervical artery dissection   Plan: Patient is alert and oriented and neurologically intact without focal deficits.  Symptoms consistent with her IIH.  She is noncompliant with her medication.  She has been given a dose of her Diamox today in the emergency department.  Plan for discharge with close neurology follow-up outpatient as well as emphasizing importance of medication compliance in the future.  Unfortunately, patient's BMP did hemolyzed and will require a redraw which is pending at shift change.  If unremarkable, patient will be stable for discharge per previous provider recommendations.  BMP resulted and reveals no acute laboratory findings.  Discussed these findings with patient, she states that she feels ready to go home at this time.  Emphasized importance of close neurology follow-up as well as adherence to her Diamox. Evaluation and diagnostic testing in the emergency department does not suggest an emergent condition requiring admission or immediate intervention beyond what has been performed at this time.  Plan for discharge with close PCP follow-up.  Patient is understanding and amenable with plan, educated on red flag symptoms that would prompt immediate return.  Patient discharged in stable condition.    Vear Clock 08/03/22 1725    Arby Barrette,  MD 08/05/22 [phone]

## 2022-08-03 NOTE — ED Notes (Signed)
Pt request not to have the "Migraine Cocktail" d/t it causing anxiety for her last time.

## 2022-08-03 NOTE — ED Triage Notes (Signed)
Pt came in via POV d/t a HA this morning that did not respond to meds & while sitting in class at [facility] she began feeling dizzy, weak, have blurred fision & light headed, plus difficulty speaking. Rates pain 8/10 & can speak clearly while in triage.

## 2022-08-10 DIAGNOSIS — R413 Other amnesia: Secondary | ICD-10-CM | POA: Diagnosis not present

## 2022-08-10 DIAGNOSIS — G932 Benign intracranial hypertension: Secondary | ICD-10-CM | POA: Diagnosis not present

## 2022-08-10 DIAGNOSIS — G43101 Migraine with aura, not intractable, with status migrainosus: Secondary | ICD-10-CM | POA: Diagnosis not present

## 2022-08-10 DIAGNOSIS — R4789 Other speech disturbances: Secondary | ICD-10-CM | POA: Diagnosis not present

## 2022-09-02 ENCOUNTER — Encounter: Payer: Self-pay | Admitting: Medical-Surgical

## 2022-09-19 ENCOUNTER — Ambulatory Visit: Payer: Federal, State, Local not specified - PPO | Admitting: Family Medicine

## 2022-09-19 ENCOUNTER — Encounter: Payer: Self-pay | Admitting: Family Medicine

## 2022-09-19 VITALS — BP 110/76 | HR 85 | Temp 98.1°F | Resp 18 | Ht 62.0 in | Wt 179.4 lb

## 2022-09-19 DIAGNOSIS — G932 Benign intracranial hypertension: Secondary | ICD-10-CM | POA: Diagnosis not present

## 2022-09-19 DIAGNOSIS — D72829 Elevated white blood cell count, unspecified: Secondary | ICD-10-CM | POA: Diagnosis not present

## 2022-09-19 DIAGNOSIS — J0110 Acute frontal sinusitis, unspecified: Secondary | ICD-10-CM

## 2022-09-19 MED ORDER — AZITHROMYCIN 500 MG PO TABS
500.0000 mg | ORAL_TABLET | Freq: Every day | ORAL | 0 refills | Status: DC
Start: 2022-09-19 — End: 2022-12-02

## 2022-09-19 NOTE — Progress Notes (Signed)
Acute Office Visit  Subjective:     Patient ID: Kirsten Cooper, female    DOB: 1980/09/06, 42 y.o.   MRN: [id number]  Chief Complaint  Patient presents with   chest congestion]    HPI Patient is in today for acute visit.  Pt is here, new to me. She reports she's had slight throat discomfort, worse in the morning. She also reports dry throat when she gets up. She does cough up mucus in the morning for the last 3 weeks. She also has bad taste in her mouth and bad breath. She says when she first wakes up, she feels bad but as the day goes on; she feels fine. She has sinus pressure also for the last few weeks. She has hx of IAH and taking medicine for this. Sore throat is 8/10 in the mornings. Throughout the day she feels fine.   Patient Active Problem List   Diagnosis Date Noted   Malodorous urine 07/04/2022   Acute low back pain with bilateral sciatica 04/06/2022   Left anterior knee pain 04/06/2022   Non-viable pregnancy 06/22/2021   ADHD (attention deficit hyperactivity disorder) 07/16/2019   Exertional headache 04/18/2018    Review of Systems  HENT:  Positive for sinus pain.   All other systems reviewed and are negative.       Objective:    There were no vitals taken for this visit.   Physical Exam Vitals and nursing note reviewed.  Constitutional:      Appearance: Normal appearance. She is normal weight.  HENT:     Head: Normocephalic and atraumatic.     Right Ear: External ear normal.     Left Ear: External ear normal.     Nose: Nose normal.     Mouth/Throat:     Mouth: Mucous membranes are moist.  Eyes:     Pupils: Pupils are equal, round, and reactive to light.  Cardiovascular:     Rate and Rhythm: Normal rate and regular rhythm.     Pulses: Normal pulses.     Heart sounds: Normal heart sounds.  Pulmonary:     Effort: Pulmonary effort is normal.     Breath sounds: Normal breath sounds.  Skin:    General: Skin is warm.     Capillary Refill: Capillary  refill takes less than 2 seconds.  Neurological:     General: No focal deficit present.     Mental Status: She is alert and oriented to person, place, and time. Mental status is at baseline.  Psychiatric:        Mood and Affect: Mood normal.        Behavior: Behavior normal.        Thought Content: Thought content normal.        Judgment: Judgment normal.   No results found for any visits on 09/19/22.      Assessment & Plan:   Problem List Items Addressed This Visit   None  Acute non-recurrent frontal sinusitis -     Azithromycin; Take 1 tablet (500 mg total) by mouth daily.  Dispense: 3 tablet; Refill: 0  Idiopathic intracranial hypertension  Leukocytosis, unspecified type   Pt with likely symptoms of sinus infection. Treat with azithromycin. Explained to pt that her WBC being slightly high could be from IAH condition. As pt questioned me about her wbc count being up.  No orders of the defined types were placed in this encounter.   No follow-ups on file.  Magdalen Spatz  Wyline Mood, MD

## 2022-09-30 ENCOUNTER — Ambulatory Visit
Admission: EM | Admit: 2022-09-30 | Discharge: 2022-09-30 | Disposition: A | Discharge status: Home / Self Care | Payer: Federal, State, Local not specified - PPO | Attending: Internal Medicine | Admitting: Internal Medicine

## 2022-09-30 DIAGNOSIS — B349 Viral infection, unspecified: Secondary | ICD-10-CM | POA: Insufficient documentation

## 2022-09-30 DIAGNOSIS — J029 Acute pharyngitis, unspecified: Secondary | ICD-10-CM

## 2022-09-30 DIAGNOSIS — R051 Acute cough: Secondary | ICD-10-CM | POA: Insufficient documentation

## 2022-09-30 DIAGNOSIS — U071 COVID-19: Secondary | ICD-10-CM | POA: Diagnosis not present

## 2022-09-30 LAB — POCT RAPID STREP A (OFFICE): Rapid Strep A Screen: NEGATIVE

## 2022-09-30 MED ORDER — ALBUTEROL SULFATE HFA 108 (90 BASE) MCG/ACT IN AERS
1.0000 | INHALATION_SPRAY | Freq: Four times a day (QID) | RESPIRATORY_TRACT | 0 refills | Status: DC | PRN
Start: 2022-09-30 — End: 2023-06-07

## 2022-09-30 NOTE — ED Triage Notes (Addendum)
Pt present sore throat with chest congestion. Pt states her chest feels tight  when taking a deep breath.pt states it hurts to swallow.  Symptoms started three days,.

## 2022-09-30 NOTE — ED Provider Notes (Addendum)
[facility]    CSN: [id number] Arrival date & time: 09/30/22  1539      History   Chief Complaint Chief Complaint  Patient presents with   Sore Throat    HPI Kirsten Cooper is a 42 y.o. female  presents for evaluation of URI symptoms for 3 days. Patient reports associated symptoms of cough, congestion, ST, chest tightness. Denies N/V/D, fevers, ear pain, body aches or SOB. Patient does not have a hx of asthma or smoking. Husband has similar sx.  Pt has no other concerns at this time.    Sore Throat    Past Medical History:  Diagnosis Date   Exertional headache 04/18/2018   GERD with esophagitis 09/09/2018    Patient Active Problem List   Diagnosis Date Noted   Malodorous urine 07/04/2022   Acute low back pain with bilateral sciatica 04/06/2022   Left anterior knee pain 04/06/2022   Non-viable pregnancy 06/22/2021   ADHD (attention deficit hyperactivity disorder) 07/16/2019   Exertional headache 04/18/2018    Past Surgical History:  Procedure Laterality Date   CESAREAN SECTION     DILATION AND EVACUATION N/A 06/25/2021   Procedure: DILATATION AND EVACUATION;  Surgeon: Adam Phenix, MD;  Location: [facility] OR;  Service: Gynecology;  Laterality: N/A;    OB History     Gravida  5   Para  2   Term  2   Preterm      AB  3   Living  2      SAB  1   IAB  1   Ectopic  1   Multiple      Live Births  2            Home Medications    Prior to Admission medications   Medication Sig Start Date End Date Taking? Authorizing Provider  albuterol (VENTOLIN HFA) 108 (90 Base) MCG/ACT inhaler Inhale 1-2 puffs into the lungs every 6 (six) hours as needed for wheezing or shortness of breath. 09/30/22  Yes Radford Pax, NP  acetaZOLAMIDE (DIAMOX) 125 MG tablet Take 125 mg by mouth 2 (two) times daily.    [provider]  Ascorbic Acid (VITAMIN C) 100 MG tablet Take 100 mg by mouth daily.    [provider]  azithromycin (ZITHROMAX)  500 MG tablet Take 1 tablet (500 mg total) by mouth daily. 09/19/22   Suzan Slick, MD  cholecalciferol (VITAMIN D3) 25 MCG (1000 UNIT) tablet Take 1,000 Units by mouth daily.    [provider]  Multiple Vitamin (MULTIVITAMIN) tablet Take 1 tablet by mouth daily.    [provider]  phenazopyridine (PYRIDIUM) 200 MG tablet Take 1 tablet (200 mg total) by mouth 3 (three) times daily as needed for pain. Patient not taking: Reported on 09/19/2022 07/04/22   Charlton Amor, DO    Family History Family History  Problem Relation Age of Onset   COPD Mother    Alzheimer's disease Paternal Grandmother     Social History Social History   Tobacco Use   Smoking status: Never    Passive exposure: Past   Smokeless tobacco: Never  Vaping Use   Vaping status: Never Used  Substance Use Topics   Alcohol use: Not Currently   Drug use: Never     Allergies   Patient has no known allergies.   Review of Systems Review of Systems  HENT:  Positive for congestion and sore throat.   Respiratory:  Positive  for cough and chest tightness.      Physical Exam Triage Vital Signs ED Triage Vitals  Encounter Vitals Group     BP 09/30/22 1556 118/76     Systolic BP Percentile --      Diastolic BP Percentile --      Pulse Rate 09/30/22 1556 94     Resp 09/30/22 1556 17     Temp 09/30/22 1556 98.8 F (37.1 C)     Temp Source 09/30/22 1556 Oral     SpO2 09/30/22 1556 100 %     Weight --      Height --      Head Circumference --      Peak Flow --      Pain Score 09/30/22 1557 9     Pain Loc --      Pain Education --      Exclude from Growth Chart --    No data found.  Updated Vital Signs BP 118/76 (BP Location: Left Arm)   Pulse 94   Temp 98.8 F (37.1 C) (Oral)   Resp 17   LMP 08/13/2022   SpO2 100%   Visual Acuity Right Eye Distance:   Left Eye Distance:   Bilateral Distance:    Right Eye Near:   Left Eye Near:    Bilateral Near:     Physical  Exam Vitals and nursing note reviewed.  Constitutional:      General: She is not in acute distress.    Appearance: She is well-developed. She is not ill-appearing.  HENT:     Head: Normocephalic and atraumatic.     Right Ear: Tympanic membrane and ear canal normal.     Left Ear: Tympanic membrane and ear canal normal.     Nose: Congestion present.     Mouth/Throat:     Mouth: Mucous membranes are moist.     Pharynx: Oropharynx is clear. Uvula midline. Posterior oropharyngeal erythema present.     Tonsils: No tonsillar exudate or tonsillar abscesses.  Eyes:     Conjunctiva/sclera: Conjunctivae normal.     Pupils: Pupils are equal, round, and reactive to light.  Cardiovascular:     Rate and Rhythm: Normal rate and regular rhythm.     Heart sounds: Normal heart sounds.  Pulmonary:     Effort: Pulmonary effort is normal.     Breath sounds: Normal breath sounds.  Musculoskeletal:     Cervical back: Normal range of motion and neck supple.  Lymphadenopathy:     Cervical: No cervical adenopathy.  Skin:    General: Skin is warm and dry.  Neurological:     General: No focal deficit present.     Mental Status: She is alert and oriented to person, place, and time.  Psychiatric:        Mood and Affect: Mood normal.        Behavior: Behavior normal.      UC Treatments / Results  Labs (all labs ordered are listed, but only abnormal results are displayed) Labs Reviewed  SARS CORONAVIRUS 2 (TAT 6-24 HRS)  CULTURE, GROUP A STREP [facility])  POCT RAPID STREP A (OFFICE)    EKG   Radiology No results found.  Procedures Procedures (including critical care time)  Medications Ordered in UC Medications - No data to display  Initial Impression / Assessment and Plan / UC Course  I have reviewed the triage vital signs and the nursing notes.  Pertinent labs & imaging results that were  available during my care of the patient were reviewed by me and considered in my medical decision  making (see chart for details).     Negative rapid strep, will send COVID PCR. Discussed viral illness and symptomatic treatment. Albuterol inhaler as needed. Pt to use OTC cough medication as needed. PCP follow up if symptoms do not improve. ER precautions reviewed.  Final Clinical Impressions(s) / UC Diagnoses   Final diagnoses:  Acute cough  Viral illness  Sore throat     Discharge Instructions      The clinic will contact you with the results of the COVID test done today. Please treat your symptoms with over the counter cough medication, tylenol or ibuprofen, humidifier, and rest. Viral illnesses can last 7-14 days. Albuterol inhaler as needed. Please follow up with your PCP if your symptoms are not improving. Please go to the ER for any worsening symptoms. This includes but is not limited to fever you can not control with tylenol or ibuprofen, you are not able to stay hydrated, you have shortness of breath or chest pain.  Thank you for choosing [facility] for your healthcare needs. I hope you feel better soon!      ED Prescriptions     Medication Sig Dispense Auth. Provider   albuterol (VENTOLIN HFA) 108 (90 Base) MCG/ACT inhaler Inhale 1-2 puffs into the lungs every 6 (six) hours as needed for wheezing or shortness of breath. 1 each Radford Pax, NP      PDMP not reviewed this encounter.   Radford Pax, NP 09/30/22 1616    Radford Pax, NP 09/30/22 [phone]

## 2022-09-30 NOTE — Discharge Instructions (Addendum)
The clinic will contact you with the results of the COVID test done today. Please treat your symptoms with over the counter cough medication, tylenol or ibuprofen, humidifier, and rest. Viral illnesses can last 7-14 days. Albuterol inhaler as needed. Please follow up with your PCP if your symptoms are not improving. Please go to the ER for any worsening symptoms. This includes but is not limited to fever you can not control with tylenol or ibuprofen, you are not able to stay hydrated, you have shortness of breath or chest pain.  Thank you for choosing [facility] for your healthcare needs. I hope you feel better soon!

## 2022-10-01 LAB — SARS CORONAVIRUS 2 (TAT 6-24 HRS): SARS Coronavirus 2: POSITIVE — AB

## 2022-10-11 ENCOUNTER — Other Ambulatory Visit: Payer: Self-pay | Admitting: Neurology

## 2022-10-11 DIAGNOSIS — G932 Benign intracranial hypertension: Secondary | ICD-10-CM

## 2022-10-20 ENCOUNTER — Ambulatory Visit
Admission: RE | Admit: 2022-10-20 | Discharge: 2022-10-20 | Disposition: A | Discharge status: Home / Self Care | Payer: Federal, State, Local not specified - PPO | Source: Ambulatory Visit | Attending: Neurology | Admitting: Neurology

## 2022-10-20 ENCOUNTER — Other Ambulatory Visit: Payer: Self-pay

## 2022-10-20 DIAGNOSIS — G932 Benign intracranial hypertension: Secondary | ICD-10-CM

## 2022-10-20 DIAGNOSIS — R519 Headache, unspecified: Secondary | ICD-10-CM | POA: Diagnosis not present

## 2022-10-20 MED ORDER — LIDOCAINE 1 % OPTIME INJ - NO CHARGE
5.0000 mL | Freq: Once | INTRAMUSCULAR | Status: DC
  Filled 2022-10-20: qty 6

## 2022-10-20 MED ORDER — ACETAMINOPHEN 325 MG PO TABS: 650.0000 mg | ORAL_TABLET | Freq: Four times a day (QID) | ORAL | Status: DC | PRN

## 2022-11-21 DIAGNOSIS — G932 Benign intracranial hypertension: Secondary | ICD-10-CM | POA: Diagnosis not present

## 2022-11-23 ENCOUNTER — Other Ambulatory Visit: Payer: Self-pay

## 2022-11-23 ENCOUNTER — Emergency Department: Payer: Federal, State, Local not specified - PPO

## 2022-11-23 ENCOUNTER — Emergency Department
Admission: EM | Admit: 2022-11-23 | Discharge: 2022-11-23 | Disposition: A | Discharge status: Home / Self Care | Payer: Federal, State, Local not specified - PPO | Attending: Student in an Organized Health Care Education/Training Program | Admitting: Student in an Organized Health Care Education/Training Program

## 2022-11-23 DIAGNOSIS — R29898 Other symptoms and signs involving the musculoskeletal system: Secondary | ICD-10-CM | POA: Diagnosis not present

## 2022-11-23 DIAGNOSIS — R4701 Aphasia: Secondary | ICD-10-CM | POA: Diagnosis not present

## 2022-11-23 DIAGNOSIS — R531 Weakness: Secondary | ICD-10-CM | POA: Diagnosis not present

## 2022-11-23 DIAGNOSIS — R4182 Altered mental status, unspecified: Secondary | ICD-10-CM | POA: Diagnosis not present

## 2022-11-23 DIAGNOSIS — R42 Dizziness and giddiness: Secondary | ICD-10-CM | POA: Diagnosis not present

## 2022-11-23 DIAGNOSIS — R4789 Other speech disturbances: Secondary | ICD-10-CM

## 2022-11-23 LAB — CBC
HCT: 41.5 % (ref 36.0–46.0)
Hemoglobin: 13.3 g/dL (ref 12.0–15.0)
MCH: 27.9 pg (ref 26.0–34.0)
MCHC: 32 g/dL (ref 30.0–36.0)
MCV: 87.2 fL (ref 80.0–100.0)
Platelets: 327 10*3/uL (ref 150–400)
RBC: 4.76 MIL/uL (ref 3.87–5.11)
RDW: 14 % (ref 11.5–15.5)
WBC: 11.2 10*3/uL — ABNORMAL HIGH (ref 4.0–10.5)
nRBC: 0 % (ref 0.0–0.2)

## 2022-11-23 LAB — COMPREHENSIVE METABOLIC PANEL
ALT: 12 U/L (ref 0–44)
AST: 16 U/L (ref 15–41)
Albumin: 3.5 g/dL (ref 3.5–5.0)
Alkaline Phosphatase: 116 U/L (ref 38–126)
Anion gap: 9 (ref 5–15)
BUN: 8 mg/dL (ref 6–20)
CO2: 18 mmol/L — ABNORMAL LOW (ref 22–32)
Calcium: 8.6 mg/dL — ABNORMAL LOW (ref 8.9–10.3)
Chloride: 108 mmol/L (ref 98–111)
Creatinine, Ser: 0.66 mg/dL (ref 0.44–1.00)
GFR, Estimated: >60 mL/min (ref >60)
Glucose, Bld: 90 mg/dL (ref 70–99)
Potassium: 3.5 mmol/L (ref 3.5–5.1)
Sodium: 135 mmol/L (ref 135–145)
Total Bilirubin: 0.4 mg/dL (ref 0.3–1.2)
Total Protein: 7.2 g/dL (ref 6.5–8.1)

## 2022-11-23 LAB — DIFFERENTIAL
Abs Immature Granulocytes: 0.03 10*3/uL (ref 0.00–0.07)
Basophils Absolute: 0.1 10*3/uL (ref 0.0–0.1)
Basophils Relative: 1 %
Eosinophils Absolute: 0.1 10*3/uL (ref 0.0–0.5)
Eosinophils Relative: 1 %
Immature Granulocytes: 0 %
Lymphocytes Relative: 21 %
Lymphs Abs: 2.3 10*3/uL (ref 0.7–4.0)
Monocytes Absolute: 0.6 10*3/uL (ref 0.1–1.0)
Monocytes Relative: 6 %
Neutro Abs: 8.1 10*3/uL — ABNORMAL HIGH (ref 1.7–7.7)
Neutrophils Relative %: 71 %

## 2022-11-23 LAB — PROTIME-INR
INR: 1.1 (ref 0.8–1.2)
Prothrombin Time: 14.4 s (ref 11.4–15.2)

## 2022-11-23 LAB — APTT: aPTT: 29 s (ref 24–36)

## 2022-11-23 LAB — ETHANOL: Alcohol, Ethyl (B): <10 mg/dL (ref <10)

## 2022-11-23 NOTE — ED Provider Notes (Signed)
[facility] Provider Note    Event Date/Time   First MD Initiated Contact with Patient 11/23/22 1610     (approximate)   History   Weakness   HPI  Kirsten Cooper is a 42 y.o. female who presents to the ER for evaluation of episode of generalized weakness lightheadedness word finding difficulty lasting several minutes to upwards of an hour associated with generalized weakness this occurred while she was at work around 1230.  Her symptoms have resolved.  Was found to be hypertensive during the episode denies any chest pain or pressure.  Has had this occur 3 times recently.  No personal history of CVA or TIA.  No history of hypertension or diabetes.     Physical Exam   Triage Vital Signs: ED Triage Vitals  Encounter Vitals Group     BP 11/23/22 1513 (!) 121/93     Systolic BP Percentile --      Diastolic BP Percentile --      Pulse Rate 11/23/22 1513 75     Resp 11/23/22 1513 18     Temp 11/23/22 1513 98.3 F (36.8 C)     Temp src --      SpO2 11/23/22 1513 99 %     Weight 11/23/22 1514 170 lb (77.1 kg)     Height 11/23/22 1514 5\' 2"  (1.575 m)     Head Circumference --      Peak Flow --      Pain Score 11/23/22 1513 6     Pain Loc --      Pain Education --      Exclude from Growth Chart --     Most recent vital signs: Vitals:   11/23/22 1513 11/23/22 1700  BP: (!) 121/93 102/81  Pulse: 75 69  Resp: 18 19  Temp: 98.3 F (36.8 C)   SpO2: 99% 100%     Constitutional: Alert  Eyes: Conjunctivae are normal.  Head: Atraumatic. Nose: No congestion/rhinnorhea. Mouth/Throat: Mucous membranes are moist.   Neck: Painless ROM.  Cardiovascular:   Good peripheral circulation. Respiratory: Normal respiratory effort.  No retractions.  Gastrointestinal: Soft and nontender.  Musculoskeletal:  no deformity Neurologic:  CN- intact.  No facial droop, Normal FNF.  Normal heel to shin.  Sensation intact bilaterally. Normal speech and language. No gross  focal neurologic deficits are appreciated. No gait instability. Skin:  Skin is warm, dry and intact. No rash noted. Psychiatric: Mood and affect are normal. Speech and behavior are normal.    ED Results / Procedures / Treatments   Labs (all labs ordered are listed, but only abnormal results are displayed) Labs Reviewed  CBC - Abnormal; Notable for the following components:      Result Value   WBC 11.2 (*)    All other components within normal limits  DIFFERENTIAL - Abnormal; Notable for the following components:   Neutro Abs 8.1 (*)    All other components within normal limits  COMPREHENSIVE METABOLIC PANEL - Abnormal; Notable for the following components:   CO2 18 (*)    Calcium 8.6 (*)    All other components within normal limits  PROTIME-INR  APTT  ETHANOL  POC URINE PREG, ED     EKG  ED ECG REPORT I, Willy Eddy, the attending physician, personally viewed and interpreted this ECG.   Date: 11/23/2022  EKG Time: 15:22  Rate: 70  Rhythm: sinus  Axis: normal  Intervals: normal  ST&T Change: no stemi, no  depressions    RADIOLOGY Please see ED Course for my review and interpretation.  I personally reviewed all radiographic images ordered to evaluate for the above acute complaints and reviewed radiology reports and findings.  These findings were personally discussed with the patient.  Please see medical record for radiology report.    PROCEDURES:  Critical Care performed: No  Procedures   MEDICATIONS ORDERED IN ED: Medications - No data to display   IMPRESSION / MDM / ASSESSMENT AND PLAN / ED COURSE  I reviewed the triage vital signs and the nursing notes.                              Differential diagnosis includes, but is not limited to, cva, tia, hypoglycemia, dehydration, electrolyte abnormality, dissection, sepsis  Patient presenting to the ER for evaluation of symptoms as described above.  Based on symptoms, risk factors and considered  above differential, this presenting complaint could reflect a potentially life-threatening illness therefore the patient will be placed on continuous pulse oximetry and telemetry for monitoring.  Laboratory evaluation will be sent to evaluate for the above complaints.      Clinical Course as of 11/23/22 1918  Wed Nov 23, 2022  1918 Patient's MRI fortunately is reassuring without evidence of TIA or CVA known mass.  Patient remains hemodynamically stable.  She does appear clinically appropriate for outpatient follow-up. [PR]    Clinical Course User Index [PR] Willy Eddy, MD     FINAL CLINICAL IMPRESSION(S) / ED DIAGNOSES   Final diagnoses:  Word finding difficulty  Weakness     Rx / DC Orders   ED Discharge Orders     None        Note:  This document was prepared using Dragon voice recognition software and may include unintentional dictation errors.    Willy Eddy, MD 11/23/22 1919

## 2022-11-23 NOTE — ED Triage Notes (Signed)
Pt to ED for bilateral leg weakness started 2 hours ago with positional dizziness. States was having trouble with speech earlier that has since subsided.  Also c/o pain to bilateral legs.

## 2022-12-02 ENCOUNTER — Encounter: Payer: Self-pay | Admitting: Medical-Surgical

## 2022-12-02 ENCOUNTER — Ambulatory Visit: Payer: Federal, State, Local not specified - PPO | Admitting: Medical-Surgical

## 2022-12-02 VITALS — BP 105/73 | HR 75 | Resp 20 | Ht 62.0 in | Wt 178.8 lb

## 2022-12-02 DIAGNOSIS — Z09 Encounter for follow-up examination after completed treatment for conditions other than malignant neoplasm: Secondary | ICD-10-CM

## 2022-12-02 NOTE — Progress Notes (Unsigned)
        Established patient visit  History, exam, impression, and plan:  1. Hospital discharge follow-up Pleasant 42 year old female presenting today for hospital discharge follow up. She was seen on 11/23/22 at the ED for concerns with transient aphasia along with generalized weakness that last for several hours and left her unable to walk. Reports that she was bending over and when she stood up the symptoms hit abruptly. She was unable to talk for approximately 15 minutes. Has also been having word finding difficulty, inattention, decline in STM, and reduced executive function capabilities. Her ED workup with unremarkable with no findings on labs or imaging to indicate a cause. No TIA/stroke findings. Patient reports this is her third recent visit for this. Has a neurologist but doesn't feel that it's a good fit for her and is interested in finding a new neurology provider. Had neuropsych testing in 2021. Reviewed the report in [facility] and discussed the findings there. Unclear etiology of symptoms. Consider neurological vs psychological cause. Review of previous workup shows comprehensive evaluation. No additional labs indicated. No additional imaging recommended. Recommend referral to a new neurologist but she is not sure who she'd like to see. Will let me know where she would like me to send her. Also recommend repeating neuropsych testing. She will let me know if she would like to pursue this. No further needs identified today.    Procedures performed this visit: None.  Return if symptoms worsen or fail to improve.  __________________________________ Thayer Ohm, DNP, APRN, FNP-BC Primary Care and Sports Medicine [facility] [place]

## 2022-12-03 ENCOUNTER — Encounter: Payer: Self-pay | Admitting: Medical-Surgical

## 2022-12-05 DIAGNOSIS — G245 Blepharospasm: Secondary | ICD-10-CM | POA: Diagnosis not present

## 2022-12-05 DIAGNOSIS — R519 Headache, unspecified: Secondary | ICD-10-CM | POA: Diagnosis not present

## 2022-12-05 DIAGNOSIS — R531 Weakness: Secondary | ICD-10-CM | POA: Diagnosis not present

## 2022-12-05 DIAGNOSIS — R1084 Generalized abdominal pain: Secondary | ICD-10-CM | POA: Diagnosis not present

## 2022-12-05 DIAGNOSIS — R2689 Other abnormalities of gait and mobility: Secondary | ICD-10-CM | POA: Diagnosis not present

## 2022-12-13 ENCOUNTER — Other Ambulatory Visit: Payer: Self-pay | Admitting: Medical-Surgical

## 2022-12-13 DIAGNOSIS — R519 Headache, unspecified: Secondary | ICD-10-CM | POA: Diagnosis not present

## 2022-12-13 DIAGNOSIS — R4789 Other speech disturbances: Secondary | ICD-10-CM | POA: Diagnosis not present

## 2022-12-13 DIAGNOSIS — G932 Benign intracranial hypertension: Secondary | ICD-10-CM | POA: Diagnosis not present

## 2022-12-13 DIAGNOSIS — Z1231 Encounter for screening mammogram for malignant neoplasm of breast: Secondary | ICD-10-CM

## 2022-12-13 DIAGNOSIS — G245 Blepharospasm: Secondary | ICD-10-CM | POA: Diagnosis not present

## 2022-12-27 ENCOUNTER — Ambulatory Visit
Admission: RE | Admit: 2022-12-27 | Discharge: 2022-12-27 | Disposition: A | Discharge status: Home / Self Care | Payer: Federal, State, Local not specified - PPO | Source: Ambulatory Visit | Attending: Medical-Surgical | Admitting: Medical-Surgical

## 2022-12-27 DIAGNOSIS — Z1231 Encounter for screening mammogram for malignant neoplasm of breast: Secondary | ICD-10-CM

## 2022-12-29 ENCOUNTER — Other Ambulatory Visit: Payer: Self-pay | Admitting: Medical-Surgical

## 2022-12-29 DIAGNOSIS — R928 Other abnormal and inconclusive findings on diagnostic imaging of breast: Secondary | ICD-10-CM

## 2023-01-09 ENCOUNTER — Ambulatory Visit
Admission: RE | Admit: 2023-01-09 | Discharge: 2023-01-09 | Disposition: A | Discharge status: Home / Self Care | Payer: Federal, State, Local not specified - PPO | Source: Ambulatory Visit | Attending: Medical-Surgical | Admitting: Medical-Surgical

## 2023-01-09 ENCOUNTER — Ambulatory Visit: Payer: Federal, State, Local not specified - PPO

## 2023-01-09 DIAGNOSIS — R928 Other abnormal and inconclusive findings on diagnostic imaging of breast: Secondary | ICD-10-CM | POA: Diagnosis not present

## 2023-02-27 ENCOUNTER — Encounter: Payer: Self-pay | Admitting: Medical-Surgical

## 2023-02-28 ENCOUNTER — Ambulatory Visit: Payer: Federal, State, Local not specified - PPO | Admitting: Family Medicine

## 2023-03-01 ENCOUNTER — Ambulatory Visit: Payer: Federal, State, Local not specified - PPO | Admitting: Medical-Surgical

## 2023-03-01 ENCOUNTER — Encounter: Payer: Self-pay | Admitting: Medical-Surgical

## 2023-03-01 ENCOUNTER — Telehealth: Payer: Self-pay | Admitting: Medical-Surgical

## 2023-03-01 VITALS — BP 113/78 | HR 75 | Resp 20 | Ht 62.0 in | Wt 179.8 lb

## 2023-03-01 DIAGNOSIS — G932 Benign intracranial hypertension: Secondary | ICD-10-CM | POA: Diagnosis not present

## 2023-03-01 DIAGNOSIS — L700 Acne vulgaris: Secondary | ICD-10-CM | POA: Insufficient documentation

## 2023-03-01 DIAGNOSIS — M7711 Lateral epicondylitis, right elbow: Secondary | ICD-10-CM | POA: Diagnosis not present

## 2023-03-01 MED ORDER — CELECOXIB 200 MG PO CAPS: 200.0000 mg | ORAL_CAPSULE | Freq: Two times a day (BID) | ORAL | 2 refills | Status: DC

## 2023-03-01 NOTE — Progress Notes (Signed)
        Established patient visit  History, exam, impression, and plan:  1. Idiopathic intracranial hypertension (Primary) Pleasant 42 year old female presenting today with a history of idiopathic intracranial hypertension.  She has been under the care of a neurologist who has been prescribing her acetazolamide.  She has been following up with her provider however has been less than satisfied with the approach and the experience that she has had there.  She is requesting a referral to a different neurologist today for second opinion.  Having increased episodes of significant head pressure and brain fog that are hindering her ability to perform in regular daily activities.  Over the last couple of years since her diagnosis, she has had 3 episodes of loss of motor function in the lower extremities that is accompanied by severe brain fog.  The motor function returns after 15 to 20 minutes however the brain fog last for a day or so before resolving.  She is understandably frustrated and has found that the acetazolamide is really not helpful.  Plan to refer urgently to neurology at [facility] as she reports she will not go back to her previous neurologist or to the ED for the symptoms as it has gotten very costly after several visits with no answers. - Ambulatory referral to Neurology  2. Right tennis elbow Approximately 2 months ago, she had a blood draw while at the doctor's office which was done in a very atypical manner.  After that, she developed pain in the right forearm/elbow that has worsened.  She notes that over the last week or so its gotten a little bit better but is still hindering her ability to grasp things and perform certain motions.  Her exam is consistent with right tennis elbow.  Discussed conservative measures including anti-inflammatories.  It seems the Motrin is upsetting her stomach so we will switch to Celebrex twice daily for 2 weeks then twice daily as needed.  Discussed bracing with an arm  strap.  She plans to get 1 at her local pharmacy as this will likely be the cheaper option.  Exercises for home physical therapy sent to her through MyChart.  3. Acne vulgaris Has been struggling with acne along the lower part of her face but mostly concentrated around the left jawline.  Has tried multiple over-the-counter options and facial routines but has found nothing that was helpful.  Requesting referral to dermatology.  Referral placed. - Ambulatory referral to Dermatology  Procedures performed this visit: None.  Return in about 2 months (around 05/02/2023) for follow up on IIH/ referrals (ok to be virtual).  __________________________________ Thayer Ohm, DNP, APRN, FNP-BC Primary Care and Sports Medicine [facility] [place]

## 2023-03-01 NOTE — Telephone Encounter (Signed)
Copied from CRM [id number]. Topic: Referral - Request for Referral >> Mar 01, 2023  4:24 PM Ivette P wrote: Did the patient discuss referral with their provider in the last year? Yes (If No - schedule appointment) (If Yes - send message)  Appointment offered? Yes  Type of order/referral and detailed reason for visit: Patient was given a referral to a Neurology and was scheduled with previous nuerologist that had before. Does not want to continue with this neurologist.   Preference of office, provider, location: Patient is not requesting any specific, would like a different Neurologist  If referral order, have you been seen by this specialty before? Yes (If Yes, this issue or another issue? When? Where?  Can we respond through MyChart? Yes

## 2023-03-02 ENCOUNTER — Telehealth: Payer: Self-pay | Admitting: Family Medicine

## 2023-03-02 NOTE — Telephone Encounter (Signed)
Copied from CRM [id number]. Topic: General - Other >> Mar 02, 2023  1:45 PM Whitney O wrote: Reason for CRM: patient is calling cause she have not heard anything . Just want to make sure the referral doesn't go back to wrong place. When the approval letter came in it had the old neurology on there and patient is wanting to go to the new provider.patient is wanting this request as soon as possible. Look like message from yesterday was routed to the wrong clinic . They are aware of the problem with referral . Dr Ander Slade is working to get this fixed and patient will see a update in my chart

## 2023-03-16 ENCOUNTER — Ambulatory Visit: Payer: Self-pay | Admitting: Medical-Surgical

## 2023-03-16 NOTE — Telephone Encounter (Signed)
 Reason for Disposition  [1] MODERATE pain (e.g., interferes with normal activities, limping) AND [2] present > 3 days  Answer Assessment - Initial Assessment Questions 1. ONSET: When did the pain start?      2 Weeks  2. LOCATION: Where is the pain located?      Bilateral legs  3. PAIN: How bad is the pain?    (Scale 1-10; or mild, moderate, severe)   -  MILD (1-3): doesn't interfere with normal activities    -  MODERATE (4-7): interferes with normal activities (e.g., work or school) or awakens from sleep, limping    -  SEVERE (8-10): excruciating pain, unable to do any normal activities, unable to walk     7/10, can worsen with activity  4. CAUSE: What do you think is causing the leg pain?     Unknown  5. OTHER SYMPTOMS: Do you have any other symptoms? (e.g., chest pain, back pain, breathing difficulty, swelling, rash, fever, numbness, weakness)     Tingling, weakness, intermittent back pain  Protocols used: Leg Pain-A-AH   Chief Complaint: Pain in both legs Symptoms: Bilateral leg pain, tingling, and weakness Frequency: For past 2 weeks Disposition: [] ED /[] Urgent Care (no appt availability in office) / [x] Appointment(In office/virtual)/ []  [facility]/ [] Home Care/ [] Refused Recommended Disposition /[] [facility]/ []  Follow-up with PCP  Additional Notes: Patient stated that she has been having pain, tingling, and weakness in both legs for the past 2 weeks. She feels like it is gradually getting worse. She is still able to walk. There is a lot of weakness but she is able to hold herself up. She stated that Zada Palin, NP is aware of this issue. I offered to schedule an appointment tomorrow. Patient wants to see what the provider recommends first. Does Zada Palin, NP want to see her or does she recommended going somewhere else. Patient stated that she has seen a Neurologist before and is waiting to transfer care. She stated Joy put in a Neurology  referral recently but she has not heard back. She is wondering if we can help with this.

## 2023-03-20 NOTE — Telephone Encounter (Signed)
 Referral, clinical notes, imaging results, lab reports, demographics and copies of insurance cards have been faxed to [facility] at [phone]. Office will contact patient to schedule referral appointment.      Referral previously sent to [facility] but referral appointment was scheduled 6 months out.

## 2023-03-24 DIAGNOSIS — Z1330 Encounter for screening examination for mental health and behavioral disorders, unspecified: Secondary | ICD-10-CM | POA: Diagnosis not present

## 2023-03-24 DIAGNOSIS — G932 Benign intracranial hypertension: Secondary | ICD-10-CM | POA: Diagnosis not present

## 2023-03-24 DIAGNOSIS — R4189 Other symptoms and signs involving cognitive functions and awareness: Secondary | ICD-10-CM | POA: Diagnosis not present

## 2023-03-30 DIAGNOSIS — Z23 Encounter for immunization: Secondary | ICD-10-CM | POA: Diagnosis not present

## 2023-06-07 ENCOUNTER — Other Ambulatory Visit: Payer: Self-pay | Admitting: Medical-Surgical

## 2023-06-07 ENCOUNTER — Ambulatory Visit

## 2023-06-07 ENCOUNTER — Ambulatory Visit: Admitting: Medical-Surgical

## 2023-06-07 ENCOUNTER — Encounter: Payer: Self-pay | Admitting: Medical-Surgical

## 2023-06-07 VITALS — BP 112/76 | HR 73 | Resp 20 | Ht 62.0 in | Wt 176.2 lb

## 2023-06-07 DIAGNOSIS — R1032 Left lower quadrant pain: Secondary | ICD-10-CM

## 2023-06-07 DIAGNOSIS — R5383 Other fatigue: Secondary | ICD-10-CM

## 2023-06-07 DIAGNOSIS — M7711 Lateral epicondylitis, right elbow: Secondary | ICD-10-CM

## 2023-06-07 DIAGNOSIS — Z13220 Encounter for screening for lipoid disorders: Secondary | ICD-10-CM

## 2023-06-07 DIAGNOSIS — K130 Diseases of lips: Secondary | ICD-10-CM | POA: Insufficient documentation

## 2023-06-07 DIAGNOSIS — M25361 Other instability, right knee: Secondary | ICD-10-CM

## 2023-06-07 DIAGNOSIS — N921 Excessive and frequent menstruation with irregular cycle: Secondary | ICD-10-CM | POA: Diagnosis not present

## 2023-06-07 DIAGNOSIS — R635 Abnormal weight gain: Secondary | ICD-10-CM | POA: Diagnosis not present

## 2023-06-07 DIAGNOSIS — M25561 Pain in right knee: Secondary | ICD-10-CM | POA: Diagnosis not present

## 2023-06-07 DIAGNOSIS — R109 Unspecified abdominal pain: Secondary | ICD-10-CM | POA: Diagnosis not present

## 2023-06-07 DIAGNOSIS — D259 Leiomyoma of uterus, unspecified: Secondary | ICD-10-CM | POA: Diagnosis not present

## 2023-06-07 MED ORDER — NYSTATIN-TRIAMCINOLONE 100000-0.1 UNIT/GM-% EX OINT: 1.0000 | TOPICAL_OINTMENT | Freq: Two times a day (BID) | CUTANEOUS | 0 refills | Status: AC

## 2023-06-07 NOTE — Progress Notes (Unsigned)
        Established patient visit  History, exam, impression, and plan:  1. Weight gain (Primary) Very pleasant 43 year old female presenting today with a history of being concerned regarding weight gain.  She has been doing well overall and has started working on a healthier lifestyle.  She is going to the gym several days a week and doing cardiovascular exercises along with strength training.  She is now turning her attention towards her dietary habits and working to make changes that will help with weight loss and overall health.  Discussed recommendations for increased protein, low-fat, and moderate carbohydrates.  Also discussed resources for recipes and nutritional information.  Recommend continuing to work on regular intentional exercise.  Consider meal prepping.  No medication indicated today.  2. Right tennis elbow Has been dealing with right tennis elbow for several months.  So far, conservative measures have failed and she is still experiencing pain in the right forearm and elbow.  Discussed treatment options.  At this point, recommend she continue conservative measures with bracing, ice/heat, and rest.  He has not completed physical therapy so referring today. - Ambulatory referral to Physical Therapy  3. Angular cheilitis Reports that she has a spot at the corner of her lips on the right side that has been very dry and irritated for several months.  Notes that saliva pools in that area although she does not know why.  She has been using vitamin E oil and Vaseline on it but it does not seem to go away.  The left side is unaffected.  On evaluation, she does have topicals on the area so unable to clearly distinguish.  Reported symptoms consistent with angular cheilitis.  Adding Mycolog twice daily to the affected area for up to 14 days.  4. Right knee buckling Has history of right knee issues and reports that it is getting worse.  When climbing stairs, she feels as if the knee slides to the  side and almost dislocates.  She has had imaging last year but these were part of her left knee evaluation and not comprehensive.  Plan to update x-rays today.  Recommend anti-inflammatories.  Referring to physical therapy. - Ambulatory referral to Physical Therapy  5. Breakthrough bleeding Has had breakthrough bleeding over the last few months.  Notes her periods are usually quite regular however in the middle of her cycle, she has experienced sharp pain on the left lower quadrant followed by bleeding/spotting.  She had a second episode of this within the last month.  Interested in further evaluation.  Consider possible ovarian cyst.  Ordering ultrasound today to evaluate for structural concerns. - US Pelvic Complete With Transvaginal; Future  6. Fatigue, unspecified type History of fatigue however it seems to be worsening.  She notes that she feels tired and weak but on the inside.  Able to function normally however her internal symptoms make her feel exhausted and she has difficulty pushing through.  Unclear etiology to this.  Plan to check labs as below per patient request. - CBC with Differential/Platelet - CMP14+EGFR - TSH - VITAMIN D 25 Hydroxy (Vit-D Deficiency, Fractures) - Iron, TIBC and Ferritin Panel - Magnesium  7. Lipid screening Checking lipids today. - Lipid panel   Procedures performed this visit: None.  Return if symptoms worsen or fail to improve.  __________________________________ Thayer Ohm, DNP, APRN, FNP-BC Primary Care and Sports Medicine [facility] [place]

## 2023-06-08 ENCOUNTER — Encounter: Payer: Self-pay | Admitting: Medical-Surgical

## 2023-06-08 DIAGNOSIS — G932 Benign intracranial hypertension: Secondary | ICD-10-CM | POA: Diagnosis not present

## 2023-06-08 DIAGNOSIS — R519 Headache, unspecified: Secondary | ICD-10-CM | POA: Diagnosis not present

## 2023-06-08 LAB — LIPID PANEL
Chol/HDL Ratio: 3.8 ratio (ref 0.0–4.4)
Cholesterol, Total: 227 mg/dL — ABNORMAL HIGH (ref 100–199)
HDL: 60 mg/dL (ref >39)
LDL Chol Calc (NIH): 156 mg/dL — ABNORMAL HIGH (ref 0–99)
Triglycerides: 63 mg/dL (ref 0–149)
VLDL Cholesterol Cal: 11 mg/dL (ref 5–40)

## 2023-06-08 LAB — CMP14+EGFR
ALT: 14 IU/L (ref 0–32)
AST: 26 IU/L (ref 0–40)
Albumin: 4.6 g/dL (ref 3.9–4.9)
Alkaline Phosphatase: 144 IU/L — ABNORMAL HIGH (ref 44–121)
BUN/Creatinine Ratio: 13 (ref 9–23)
BUN: 11 mg/dL (ref 6–24)
Bilirubin Total: 0.5 mg/dL (ref 0.0–1.2)
CO2: 17 mmol/L — ABNORMAL LOW (ref 20–29)
Calcium: 9.3 mg/dL (ref 8.7–10.2)
Chloride: 100 mmol/L (ref 96–106)
Creatinine, Ser: 0.86 mg/dL (ref 0.57–1.00)
Globulin, Total: 3.4 g/dL (ref 1.5–4.5)
Glucose: 61 mg/dL — ABNORMAL LOW (ref 70–99)
Potassium: 4.3 mmol/L (ref 3.5–5.2)
Sodium: 136 mmol/L (ref 134–144)
Total Protein: 8 g/dL (ref 6.0–8.5)
eGFR: 85 mL/min/{1.73_m2} (ref >59)

## 2023-06-08 LAB — VITAMIN D 25 HYDROXY (VIT D DEFICIENCY, FRACTURES): Vit D, 25-Hydroxy: 27.8 ng/mL — ABNORMAL LOW (ref 30.0–100.0)

## 2023-06-08 LAB — CBC WITH DIFFERENTIAL/PLATELET
Basophils Absolute: 0.1 10*3/uL (ref 0.0–0.2)
Basos: 1 %
EOS (ABSOLUTE): 0.1 10*3/uL (ref 0.0–0.4)
Eos: 1 %
Hematocrit: 45.6 % (ref 34.0–46.6)
Hemoglobin: 14.9 g/dL (ref 11.1–15.9)
Immature Grans (Abs): 0 10*3/uL (ref 0.0–0.1)
Immature Granulocytes: 0 %
Lymphocytes Absolute: 2.9 10*3/uL (ref 0.7–3.1)
Lymphs: 27 %
MCH: 29.1 pg (ref 26.6–33.0)
MCHC: 32.7 g/dL (ref 31.5–35.7)
MCV: 89 fL (ref 79–97)
Monocytes Absolute: 0.7 10*3/uL (ref 0.1–0.9)
Monocytes: 6 %
Neutrophils Absolute: 7.1 10*3/uL — ABNORMAL HIGH (ref 1.4–7.0)
Neutrophils: 65 %
Platelets: 293 10*3/uL (ref 150–450)
RBC: 5.12 x10E6/uL (ref 3.77–5.28)
RDW: 13.1 % (ref 11.7–15.4)
WBC: 10.9 10*3/uL — ABNORMAL HIGH (ref 3.4–10.8)

## 2023-06-08 LAB — IRON,TIBC AND FERRITIN PANEL
Ferritin: 43 ng/mL (ref 15–150)
Iron Saturation: 25 % (ref 15–55)
Iron: 101 ug/dL (ref 27–159)
Total Iron Binding Capacity: 400 ug/dL (ref 250–450)
UIBC: 299 ug/dL (ref 131–425)

## 2023-06-08 LAB — MAGNESIUM: Magnesium: 2 mg/dL (ref 1.6–2.3)

## 2023-06-08 LAB — TSH: TSH: 1.3 u[IU]/mL (ref 0.450–4.500)

## 2023-06-09 ENCOUNTER — Encounter: Payer: Self-pay | Admitting: Medical-Surgical

## 2023-06-09 DIAGNOSIS — N921 Excessive and frequent menstruation with irregular cycle: Secondary | ICD-10-CM

## 2023-06-09 DIAGNOSIS — D259 Leiomyoma of uterus, unspecified: Secondary | ICD-10-CM

## 2023-06-12 NOTE — Telephone Encounter (Signed)
 Pended referral

## 2023-06-15 ENCOUNTER — Ambulatory Visit

## 2023-06-16 ENCOUNTER — Encounter: Payer: Self-pay | Admitting: Medical-Surgical

## 2023-07-03 ENCOUNTER — Ambulatory Visit: Admitting: Obstetrics and Gynecology

## 2023-07-03 ENCOUNTER — Encounter: Payer: Self-pay | Admitting: Obstetrics and Gynecology

## 2023-07-03 ENCOUNTER — Other Ambulatory Visit (HOSPITAL_COMMUNITY)
Admission: RE | Admit: 2023-07-03 | Discharge: 2023-07-03 | Disposition: A | Discharge status: Home / Self Care | Source: Ambulatory Visit | Attending: Obstetrics and Gynecology | Admitting: Obstetrics and Gynecology

## 2023-07-03 VITALS — BP 128/83 | HR 78 | Wt 178.0 lb

## 2023-07-03 DIAGNOSIS — Z1211 Encounter for screening for malignant neoplasm of colon: Secondary | ICD-10-CM | POA: Diagnosis not present

## 2023-07-03 DIAGNOSIS — N926 Irregular menstruation, unspecified: Secondary | ICD-10-CM | POA: Diagnosis present

## 2023-07-03 DIAGNOSIS — R140 Abdominal distension (gaseous): Secondary | ICD-10-CM

## 2023-07-03 DIAGNOSIS — R3129 Other microscopic hematuria: Secondary | ICD-10-CM | POA: Diagnosis not present

## 2023-07-03 DIAGNOSIS — Z113 Encounter for screening for infections with a predominantly sexual mode of transmission: Secondary | ICD-10-CM | POA: Insufficient documentation

## 2023-07-03 LAB — POCT URINALYSIS DIPSTICK
Spec Grav, UA: 1.01 (ref 1.010–1.025)
pH, UA: 6 (ref 5.0–8.0)

## 2023-07-03 NOTE — Progress Notes (Signed)
 Obstetrics and Gynecology New Patient Evaluation  Appointment Date: 07/03/2023  OBGYN Clinic: [facility]  Primary Care Provider: Cherre Cooper  Referring Provider: Cherre Cornish, NP  Chief Complaint: mid cycle spotting, bleeding and cramping  History of Present Illness: Kirsten Cooper is a 43 y.o.  [phone] , seen for the above chief complaint. Her past medical history is significant for c/s x 2, h/o IVF  Patient seen by pcp on 3/26 with above CC. Work up done and normal CBC, TSH and with prior normal pap in 2024. U/s also ordered and normal with a small IM 1.5cm fibroid noted (see below).  Patient states cycles are still qmonth, regular <1wk and not particularly heavy or painful and she has the mid cycle bleeding (?after exercise) with last episode about two weeks ago  Pt also notes bloating ?since around this time; weight is down (intentionally) about 5-10 lbs over the past year or so.   Review of Systems: Pertinent items are noted in HPI.   Patient Active Problem List   Diagnosis Date Noted   Abdominal bloating 07/03/2023   Right knee buckling 06/07/2023   Angular cheilitis 06/07/2023   Idiopathic intracranial hypertension 03/01/2023   Right tennis elbow 03/01/2023   Acne vulgaris 03/01/2023   Acute low back pain with bilateral sciatica 04/06/2022   Left anterior knee pain 04/06/2022   ADHD (attention deficit hyperactivity disorder) 07/16/2019   Exertional headache 04/18/2018    Past Medical History:  Past Medical History:  Diagnosis Date   Exertional headache 04/18/2018   GERD with esophagitis 09/09/2018    Past Surgical History:  Past Surgical History:  Procedure Laterality Date   CESAREAN SECTION     DILATION AND EVACUATION N/A 06/25/2021   Procedure: DILATATION AND EVACUATION;  Surgeon: Kirsten Fruit, MD;  Location: [facility] OR;  Service: Gynecology;  Laterality: N/A;    Past Obstetrical History:  OB History  Gravida Para Term Preterm AB  Living  5 2 2  3 2   SAB IAB Ectopic Multiple Live Births  1 1 1  2     # Outcome Date GA Lbr Len/2nd Weight Sex Type Anes PTL Lv  5 SAB 06/25/21     SAB     4 Term 05/19/00     CS-LTranv     3 Term 06/29/95     CS-LTranv     2 Ectopic           1 IAB             Past Gynecological History: As per HPI. History of Pap Smear(s): Yes.   Last pap 2024, which was negative and HPV negative.  She is currently using no method for contraception.   Social History:  Social History   Socioeconomic History   Marital status: Married    Spouse name: Not on file   Number of children: 2   Years of education: 16   Highest education level: Bachelor's degree (e.g., BA, AB, BS)  Occupational History   Occupation: Advice worker: [employer]   Occupation: Social Work  Tobacco Use   Smoking status: Never    Passive exposure: Past   Smokeless tobacco: Never  Vaping Use   Vaping status: Never Used  Substance and Sexual Activity   Alcohol use: Not Currently   Drug use: Never   Sexual activity: Yes    Partners: Male    Birth control/protection: None  Other Topics Concern   Not on  file  Social History Narrative   Lives with Kirsten Cooper, husband, and daughter Kirsten Cooper.  No pets.   Social Drivers of Corporate investment banker Strain: Low Risk  (03/24/2023)   Received from [facility]   Overall Financial Resource Strain (CARDIA)    Difficulty of Paying Living Expenses: Not hard at all  Food Insecurity: No Food Insecurity (03/24/2023)   Received from [facility]   Hunger Vital Sign    Worried About Running Out of Food in the Last Year: Never true    Ran Out of Food in the Last Year: Never true  Transportation Needs: No Transportation Needs (03/24/2023)   Received from [facility] - Transportation    Lack of Transportation (Medical): No    Lack of Transportation (Non-Medical): No  Physical Activity: Not on file  Stress: Not on file  Social Connections: Not on file   Intimate Partner Violence: Not on file    Family History:  Family History  Problem Relation Age of Onset   COPD Mother    Breast cancer Paternal Grandmother    Alzheimer's disease Paternal Grandmother     Health Maintenance:  Mammogram(s): Yes.   Date: 12/2022, birads 1 Colonoscopy: No.  Medications Kirsten Cooper had no medications administered during this visit. Current Outpatient Medications  Medication Sig Dispense Refill   acetaZOLAMIDE  (DIAMOX ) 125 MG tablet Take 125 mg by mouth 2 (two) times daily.     nystatin -triamcinolone  ointment (MYCOLOG) Apply 1 Application topically 2 (two) times daily. (Patient not taking: Reported on 07/03/2023) 30 g 0   No current facility-administered medications for this visit.   Allergies Patient has no known allergies.  Physical Exam:  BP 128/83   Pulse 78   Wt 178 lb (80.7 kg)   BMI 32.56 kg/m  Body mass index is 32.56 kg/m. General appearance: Well nourished, well developed female in no acute distress.  Neck:  Supple, normal appearance, and no thyromegaly  Cardiovascular: normal s1 and s2.  No murmurs, rubs or gallops. Respiratory:  Clear to auscultation bilateral. Normal respiratory effort Abdomen: positive bowel sounds and no masses, hernias; diffusely non tender to palpation, non distended Neuro/Psych:  Normal mood and affect.  Skin:  Warm and dry.  Lymphatic:  No inguinal lymphadenopathy.   Cervical exam performed in the presence of a chaperone Pelvic exam: is not limited by body habitus EGBUS: within normal limits Vagina: within normal limits and with no blood or discharge in the vault Cervix: normal appearing cervix without tenderness, discharge or lesions. Uterus:  nonenlarged and non tender Adnexa:  normal adnexa and no mass, fullness, tenderness Rectovaginal: deferred  Laboratory: u/a with trace blood  Radiology:  Narrative & Impression  CLINICAL DATA:  Breakthrough bleeding which sharp left lower quadrant  abdomen pain.   EXAM: TRANSABDOMINAL AND TRANSVAGINAL ULTRASOUND OF PELVIS   TECHNIQUE: Both transabdominal and transvaginal ultrasound examinations of the pelvis were performed. Transabdominal technique was performed for global imaging of the pelvis including uterus, ovaries, adnexal regions, and pelvic cul-de-sac. It was necessary to proceed with endovaginal exam following the transabdominal exam to visualize the endometrium, uterus and ovaries.   COMPARISON:  CT abdomen and pelvis September 27, 2018   FINDINGS: Uterus   Measurements: 7.5 x 4.4 x 4.9 cm = volume: 84.7 mL. Small uterine fibroids are identified, largest measures 1.3 x 1.2 x 0.9 cm in the posterior right myometrium.   Endometrium   Thickness: 9.6 mm.  No focal abnormality visualized.   Right  ovary   Measurements: 3.7 x 1.5 x 1.5 cm = volume: 4.4 mL. Normal appearance/no adnexal mass.   Left ovary   Measurements: 3.4 x 2.7 x 2.5 cm = volume: 11.9 mL. Normal appearance/no adnexal mass.   Other findings   Trace free fluid is identified, likely physiologic.   IMPRESSION: 1. No acute abnormality identified. 2. Small uterine fibroids.     Electronically Signed   By: Anna Barnes M.D.   On: 06/09/2023 13:53   Assessment: patient stable  Plan:  1. Irregular bleeding (Primary) I d/w her that based on what I'm hearing from her and seeing on u/s I don't think that an endometrial biopsy is indicated currently; ?mittelschmerz bleeding. F/u s/s in a few months and proceed accordingly - Urine Culture - POCT Urinalysis Dipstick - Cervicovaginal ancillary only( [place])  2. Abdominal bloating Referred to GI for evaluation and/or potential screening colonoscopy since she's a black woman age 77 or greater.    Return in about 2 months (around 09/02/2023) for in person, with dr Aquilla Knapp.  No future appointments.  Tyler Gallant MD Attending [facility])

## 2023-07-03 NOTE — Progress Notes (Signed)
 RGYN here for a problem visit.   CC: Breakthrough bleeding. Also notes abdominal pain was originally just left side not is both feels as if she is bloated.  U/S on 06/07/23

## 2023-07-05 LAB — CERVICOVAGINAL ANCILLARY ONLY
Bacterial Vaginitis (gardnerella): NEGATIVE
Candida Glabrata: NEGATIVE
Candida Vaginitis: POSITIVE — AB
Chlamydia: NEGATIVE
Comment: NEGATIVE
Comment: NEGATIVE
Comment: NEGATIVE
Comment: NEGATIVE
Comment: NEGATIVE
Comment: NORMAL
Neisseria Gonorrhea: NEGATIVE
Trichomonas: NEGATIVE

## 2023-07-07 LAB — URINE CULTURE

## 2023-07-08 ENCOUNTER — Encounter: Payer: Self-pay | Admitting: Obstetrics and Gynecology

## 2023-07-08 MED ORDER — FLUCONAZOLE 150 MG PO TABS: 150.0000 mg | ORAL_TABLET | Freq: Once | ORAL | 0 refills | Status: AC

## 2023-07-08 MED ORDER — SULFAMETHOXAZOLE-TRIMETHOPRIM 800-160 MG PO TABS: 1.0000 | ORAL_TABLET | Freq: Two times a day (BID) | ORAL | 0 refills | Status: AC

## 2023-07-18 DIAGNOSIS — G932 Benign intracranial hypertension: Secondary | ICD-10-CM | POA: Diagnosis not present

## 2023-08-14 ENCOUNTER — Emergency Department (HOSPITAL_BASED_OUTPATIENT_CLINIC_OR_DEPARTMENT_OTHER)

## 2023-08-14 ENCOUNTER — Telehealth: Payer: Self-pay | Admitting: Medical-Surgical

## 2023-08-14 ENCOUNTER — Other Ambulatory Visit: Payer: Self-pay

## 2023-08-14 ENCOUNTER — Encounter (HOSPITAL_BASED_OUTPATIENT_CLINIC_OR_DEPARTMENT_OTHER): Payer: Self-pay

## 2023-08-14 ENCOUNTER — Emergency Department (HOSPITAL_BASED_OUTPATIENT_CLINIC_OR_DEPARTMENT_OTHER)
Admission: EM | Admit: 2023-08-14 | Discharge: 2023-08-14 | Disposition: A | Discharge status: Home / Self Care | Attending: Emergency Medicine | Admitting: Emergency Medicine

## 2023-08-14 DIAGNOSIS — K2900 Acute gastritis without bleeding: Secondary | ICD-10-CM | POA: Insufficient documentation

## 2023-08-14 DIAGNOSIS — I10 Essential (primary) hypertension: Secondary | ICD-10-CM | POA: Insufficient documentation

## 2023-08-14 DIAGNOSIS — R1013 Epigastric pain: Secondary | ICD-10-CM | POA: Diagnosis not present

## 2023-08-14 DIAGNOSIS — R109 Unspecified abdominal pain: Secondary | ICD-10-CM | POA: Diagnosis not present

## 2023-08-14 LAB — PREGNANCY, URINE: Preg Test, Ur: NEGATIVE

## 2023-08-14 LAB — COMPREHENSIVE METABOLIC PANEL WITH GFR
ALT: 6 U/L (ref 0–44)
AST: 20 U/L (ref 15–41)
Albumin: 3.7 g/dL (ref 3.5–5.0)
Alkaline Phosphatase: 125 U/L (ref 38–126)
Anion gap: 11 (ref 5–15)
BUN: 12 mg/dL (ref 6–20)
CO2: 16 mmol/L — ABNORMAL LOW (ref 22–32)
Calcium: 8.9 mg/dL (ref 8.9–10.3)
Chloride: 109 mmol/L (ref 98–111)
Creatinine, Ser: 0.89 mg/dL (ref 0.44–1.00)
GFR, Estimated: >60 mL/min (ref >60)
Glucose, Bld: 70 mg/dL (ref 70–99)
Potassium: 4.4 mmol/L (ref 3.5–5.1)
Sodium: 137 mmol/L (ref 135–145)
Total Bilirubin: 0.4 mg/dL (ref 0.0–1.2)
Total Protein: 6.9 g/dL (ref 6.5–8.1)

## 2023-08-14 LAB — CBC
HCT: 44.5 % (ref 36.0–46.0)
Hemoglobin: 14.5 g/dL (ref 12.0–15.0)
MCH: 28.8 pg (ref 26.0–34.0)
MCHC: 32.6 g/dL (ref 30.0–36.0)
MCV: 88.5 fL (ref 80.0–100.0)
Platelets: 305 10*3/uL (ref 150–400)
RBC: 5.03 MIL/uL (ref 3.87–5.11)
RDW: 13.1 % (ref 11.5–15.5)
WBC: 11.2 10*3/uL — ABNORMAL HIGH (ref 4.0–10.5)
nRBC: 0 % (ref 0.0–0.2)

## 2023-08-14 LAB — LIPASE, BLOOD: Lipase: 74 U/L — ABNORMAL HIGH (ref 11–51)

## 2023-08-14 LAB — URINALYSIS, ROUTINE W REFLEX MICROSCOPIC
Bilirubin Urine: NEGATIVE
Glucose, UA: NEGATIVE mg/dL
Hgb urine dipstick: NEGATIVE
Ketones, ur: NEGATIVE mg/dL
Leukocytes,Ua: NEGATIVE
Nitrite: NEGATIVE
Protein, ur: NEGATIVE mg/dL
Specific Gravity, Urine: <=1.005 (ref 1.005–1.030)
pH: 5.5 (ref 5.0–8.0)

## 2023-08-14 MED ORDER — MORPHINE SULFATE (PF) 4 MG/ML IV SOLN
4.0000 mg | Freq: Once | INTRAVENOUS | Status: DC
  Filled 2023-08-14: qty 1

## 2023-08-14 MED ORDER — ONDANSETRON HCL 4 MG/2ML IJ SOLN
4.0000 mg | Freq: Once | INTRAMUSCULAR | Status: DC
  Filled 2023-08-14: qty 2

## 2023-08-14 MED ORDER — HYOSCYAMINE SULFATE 0.125 MG SL SUBL
0.2500 mg | SUBLINGUAL_TABLET | Freq: Once | SUBLINGUAL | Status: AC
  Administered 2023-08-14: 0.25 mg via SUBLINGUAL
  Filled 2023-08-14: qty 2

## 2023-08-14 MED ORDER — ONDANSETRON 4 MG PO TBDP: 4.0000 mg | ORAL_TABLET | Freq: Three times a day (TID) | ORAL | 0 refills | Status: AC | PRN

## 2023-08-14 MED ORDER — ACETAMINOPHEN 500 MG PO TABS
1000.0000 mg | ORAL_TABLET | Freq: Once | ORAL | Status: AC
  Administered 2023-08-14: 1000 mg via ORAL
  Filled 2023-08-14: qty 2

## 2023-08-14 MED ORDER — ALUM & MAG HYDROXIDE-SIMETH 200-200-20 MG/5ML PO SUSP
30.0000 mL | Freq: Once | ORAL | Status: AC
  Administered 2023-08-14: 30 mL via ORAL
  Filled 2023-08-14: qty 30

## 2023-08-14 MED ORDER — ONDANSETRON 4 MG PO TBDP
4.0000 mg | ORAL_TABLET | Freq: Once | ORAL | Status: AC
  Administered 2023-08-14: 4 mg via ORAL
  Filled 2023-08-14: qty 1

## 2023-08-14 MED ORDER — SUCRALFATE 1 G PO TABS: 1.0000 g | ORAL_TABLET | Freq: Three times a day (TID) | ORAL | 0 refills | Status: AC

## 2023-08-14 MED ORDER — PANTOPRAZOLE SODIUM 20 MG PO TBEC: 20.0000 mg | DELAYED_RELEASE_TABLET | Freq: Every day | ORAL | 1 refills | Status: AC

## 2023-08-14 MED ORDER — IOHEXOL 300 MG/ML  SOLN
100.0000 mL | Freq: Once | INTRAMUSCULAR | Status: AC | PRN
  Administered 2023-08-14: 100 mL via INTRAVENOUS

## 2023-08-14 NOTE — ED Provider Notes (Signed)
  EMERGENCY DEPARTMENT AT [facility] Provider Note   CSN: [id number] Arrival date & time: 08/14/23  1028     History  Chief Complaint  Patient presents with   Abdominal Pain    Kirsten Cooper is a 43 y.o. female.  Patient with past medical history of idiopathic intracranial hypertension, GERD with esophagitis presenting to emergency room with complaint of upper abdominal pain.  Patient reports she has had some intermittent abdominal pain and bloating over the past 2 weeks but notes that over the past 3 days she has had constant epigastric and central abdominal pain associated with nausea.  Pain and nausea is worse directly after eating. Has tried Tums without improvement.  Has not noted vomiting or change in stool.  Denies recent travel.  Did recently take antibiotics 3 weeks ago.  Denies dysuria or vaginal discharge.  Has had C-section before still has gallbladder cells appendix.   Abdominal Pain      Home Medications Prior to Admission medications   Medication Sig Start Date End Date Taking? Authorizing Provider  acetaZOLAMIDE  (DIAMOX ) 125 MG tablet Take 125 mg by mouth 2 (two) times daily.    [provider]  nystatin -triamcinolone  ointment (MYCOLOG) Apply 1 Application topically 2 (two) times daily. Patient not taking: Reported on 07/03/2023 06/07/23   Cherre Cornish, NP      Allergies    Patient has no known allergies.    Review of Systems   Review of Systems  Gastrointestinal:  Positive for abdominal pain.    Physical Exam Updated Vital Signs BP (!) 124/90   Pulse 63   Temp 98 F (36.7 C) (Oral)   Resp 16   Wt 80.3 kg   LMP 07/16/2023   SpO2 100%   BMI 32.37 kg/m  Physical Exam Vitals and nursing note reviewed.  Constitutional:      General: She is not in acute distress.    Appearance: She is not toxic-appearing.  HENT:     Head: Normocephalic and atraumatic.  Eyes:     General: No scleral icterus.    Conjunctiva/sclera:  Conjunctivae normal.  Cardiovascular:     Rate and Rhythm: Normal rate and regular rhythm.     Pulses: Normal pulses.     Heart sounds: Normal heart sounds.  Pulmonary:     Effort: Pulmonary effort is normal. No respiratory distress.     Breath sounds: Normal breath sounds.  Abdominal:     General: Abdomen is flat. Bowel sounds are normal.     Palpations: Abdomen is soft.     Tenderness: There is abdominal tenderness.     Comments: Tenderness to palpation over epigastric and periumbilical area.  No obvious distention or mass.  Skin:    General: Skin is warm and dry.     Findings: No lesion.  Neurological:     General: No focal deficit present.     Mental Status: She is alert and oriented to person, place, and time. Mental status is at baseline.     ED Results / Procedures / Treatments   Labs (all labs ordered are listed, but only abnormal results are displayed) Labs Reviewed  LIPASE, BLOOD - Abnormal; Notable for the following components:      Result Value   Lipase 74 (*)    All other components within normal limits  COMPREHENSIVE METABOLIC PANEL WITH GFR - Abnormal; Notable for the following components:   CO2 16 (*)    All other components within normal limits  URINALYSIS, ROUTINE W REFLEX MICROSCOPIC - Abnormal; Notable for the following components:   Color, Urine STRAW (*)    All other components within normal limits  CBC - Abnormal; Notable for the following components:   WBC 11.2 (*)    All other components within normal limits  PREGNANCY, URINE    EKG None  Radiology CT ABDOMEN PELVIS W CONTRAST Result Date: 08/14/2023 CLINICAL DATA:  Acute abdominal pain. EXAM: CT ABDOMEN AND PELVIS WITH CONTRAST TECHNIQUE: Multidetector CT imaging of the abdomen and pelvis was performed using the standard protocol following bolus administration of intravenous contrast. RADIATION DOSE REDUCTION: This exam was performed according to the departmental dose-optimization program which  includes automated exposure control, adjustment of the mA and/or kV according to patient size and/or use of iterative reconstruction technique. CONTRAST:  OMNIPAQUE  IOHEXOL  300 MG/ML  SOLN COMPARISON:  Chest x-ray 09/27/2018 FINDINGS: Lower chest: No acute abnormality. Hepatobiliary: No focal liver abnormality is seen. No gallstones, gallbladder wall thickening, or biliary dilatation. Pancreas: Unremarkable. No pancreatic ductal dilatation or surrounding inflammatory changes. Spleen: Normal in size without focal abnormality. Adrenals/Urinary Tract: Adrenal glands are unremarkable. Kidneys are normal, without renal calculi, focal lesion, or hydronephrosis. Bladder is unremarkable. Stomach/Bowel: Stomach is within normal limits. Appendix appears normal. No evidence of bowel wall thickening, distention, or inflammatory changes. Vascular/Lymphatic: No significant vascular findings are present. No enlarged abdominal or pelvic lymph nodes. Reproductive: Uterus and bilateral adnexa are unremarkable. Other: No abdominal wall hernia or abnormality. No abdominopelvic ascites. Musculoskeletal: No fracture is seen. IMPRESSION: No acute localizing process in the abdomen or pelvis. Electronically Signed   By: Tyron Gallon M.D.   On: 08/14/2023 16:01    Procedures Procedures    Medications Ordered in ED Medications  alum & mag hydroxide-simeth (MAALOX/MYLANTA) 200-200-20 MG/5ML suspension 30 mL (has no administration in time range)  hyoscyamine (LEVSIN SL) SL tablet 0.25 mg (has no administration in time range)  ondansetron  (ZOFRAN -ODT) disintegrating tablet 4 mg (4 mg Oral Given 08/14/23 1441)  acetaminophen  (TYLENOL ) tablet 1,000 mg (1,000 mg Oral Given 08/14/23 1441)  iohexol  (OMNIPAQUE ) 300 MG/ML solution 100 mL (100 mLs Intravenous Contrast Given 08/14/23 1454)    ED Course/ Medical Decision Making/ A&P Clinical Course as of 08/14/23 1626  Mon Aug 14, 2023  1303 Delay in labs, unable to obtain IV.  [JB]   1357 Unable to get US  IV, attending will attempt.  [JB]    Clinical Course User Index [JB] Afifa Truax, Kandace Organ, PA-C                                 Medical Decision Making Amount and/or Complexity of Data Reviewed Labs: ordered. Radiology: ordered.  Risk OTC drugs. Prescription drug management.   This patient presents to the ED for concern of abdominal pain, this involves an extensive number of treatment options, and is a complaint that carries with it a high risk of complications and morbidity.  The differential diagnosis includes angry Titus, gastritis, peptic ulcer disease, cholecystitis, hepatitis, small bowel obstruction, appendicitis, diverticulitis   Co morbidities that complicate the patient evaluation  IIH   Lab Tests:  I personally interpreted labs.  The pertinent results include:   CBC with mild leukocytosis at 11.12 which I feel is most likely reactive, hemoglobin is 14.5.  CMP without electrolyte abnormality.  Normal kidney function normal liver function.  UA without sign of urinary tract infection.  Lipase mildly elevated  at 69.  Pregnancy test negative   Imaging Studies ordered:  I ordered imaging studies including CT abd/pelvis  I independently visualized and interpreted imaging which showed no acute findings.  I agree with the radiologist interpretation   Cardiac Monitoring: / EKG:  The patient was maintained on a cardiac monitor.    Problem List / ED Course / Critical interventions / Medication management  Reporting with abdominal pain.  Has been ongoing for about a week worse over the last few days.  Has noted some decreased appetite and nausea. Reports pain is burning in nature and worse with eating.  She is hemodynamically stable and well-appearing.  Will obtain abdominal labs, CT scan. Clinically, suspicious this is peptic ulcer disease versus gastritis. CT scan shows no acute findings.  She has been stable throughout stay.  She is tolerating oral  intake.  Feeling better after Zofran  and Maalox.  No sign of infection on lab work.  Will treat as suspected peptic ulcer disease and follow-up with GI.  Patient has been following with primary care for this and an ambulatory referral was placed by PCP for GI.  Feels she is stable for discharge.  We discussed symptomatic management and return avoiding NSAIDs.  Given return precautions. I ordered medication including GI cocktail, Zofran , Tylenol . Reevaluation of the patient after these medicines showed that the patient improved I have reviewed the patients home medicines and have made adjustments as needed   Plan  F/u w/ PCP in 2-3d to ensure resolution of sx.  Patient was given return precautions. Patient stable for discharge at this time.  Patient educated on sx/dx and verbalized understanding of plan. Return to ER w/ new or worsening sx.          Final Clinical Impression(s) / ED Diagnoses Final diagnoses:  Acute gastritis without hemorrhage, unspecified gastritis type    Rx / DC Orders ED Discharge Orders          Ordered    pantoprazole (PROTONIX) 20 MG tablet  Daily        08/14/23 1616    sucralfate (CARAFATE) 1 g tablet  3 times daily with meals & bedtime        08/14/23 1616    ondansetron  (ZOFRAN -ODT) 4 MG disintegrating tablet  Every 8 hours PRN        08/14/23 1616              Simya Tercero, Kandace Organ, PA-C 08/14/23 1629    Mozell Arias, MD 08/15/23 [id number]

## 2023-08-14 NOTE — Telephone Encounter (Signed)
 I called and left a message to call back and let me know which facility she would like to go to

## 2023-08-14 NOTE — ED Triage Notes (Addendum)
 Pt reports mid to upper abdominal pain x1 week with pain intensifying over the weekend. Nausea, no vomiting or diarrhea. Pain worse after eating. Feeling bloated for the past month. Pain described as cramping. Completed course of antibiotics for UTI 3 weeks ago

## 2023-08-14 NOTE — Telephone Encounter (Signed)
 Patient is here in the office today she hasn't been able to reach scheduling for GI and is in a lot of pain can you check into it for me?

## 2023-08-14 NOTE — Telephone Encounter (Signed)
 Patient called back and asked to got to [facility] for her GI consult

## 2023-08-14 NOTE — Discharge Instructions (Addendum)
 Please call GI for follow-up.  I suspect they have stomach ulcers.  Please take medications that have prescribed today.  Please avoid anti-inflammatory medicine as this can make symptoms worse.

## 2023-08-14 NOTE — Telephone Encounter (Signed)
 Reached out to [facility], spoke with Melody to check the status of referral. Melody states that office does not  currently have provider for in office appointments. Office is only able to accept referrals for screenings at this time due to staffing restraints.   Since patient is having abdominal pain, did you want to refer to  [facility], [facility], [facility] or [facility]? Please advise.

## 2023-08-15 ENCOUNTER — Encounter

## 2023-08-16 ENCOUNTER — Encounter: Payer: Self-pay | Admitting: Gastroenterology

## 2023-08-16 ENCOUNTER — Telehealth: Payer: Self-pay

## 2023-08-16 DIAGNOSIS — G932 Benign intracranial hypertension: Secondary | ICD-10-CM | POA: Diagnosis not present

## 2023-08-16 DIAGNOSIS — H1132 Conjunctival hemorrhage, left eye: Secondary | ICD-10-CM | POA: Diagnosis not present

## 2023-08-16 NOTE — Telephone Encounter (Signed)
 Called pt to notify of gastro appt on 7/7. Left vm for pt to call office back.

## 2023-08-18 ENCOUNTER — Other Ambulatory Visit: Payer: Self-pay | Admitting: Gastroenterology

## 2023-08-18 DIAGNOSIS — Z1211 Encounter for screening for malignant neoplasm of colon: Secondary | ICD-10-CM | POA: Diagnosis not present

## 2023-08-18 DIAGNOSIS — R1010 Upper abdominal pain, unspecified: Secondary | ICD-10-CM

## 2023-08-18 DIAGNOSIS — R140 Abdominal distension (gaseous): Secondary | ICD-10-CM | POA: Diagnosis not present

## 2023-08-22 ENCOUNTER — Ambulatory Visit
Admission: RE | Admit: 2023-08-22 | Discharge: 2023-08-22 | Disposition: A | Discharge status: Home / Self Care | Source: Ambulatory Visit | Attending: Gastroenterology | Admitting: Gastroenterology

## 2023-08-22 DIAGNOSIS — R1010 Upper abdominal pain, unspecified: Secondary | ICD-10-CM | POA: Diagnosis not present

## 2023-08-23 NOTE — Telephone Encounter (Signed)
 Appointment scheduled for 09/18/2023 at 9:30am with Kirsten Cooper at [facility]. Pt aware.

## 2023-09-04 ENCOUNTER — Ambulatory Visit: Admitting: Obstetrics and Gynecology

## 2023-09-04 VITALS — BP 109/75 | HR 81 | Wt 182.0 lb

## 2023-09-04 DIAGNOSIS — Z8742 Personal history of other diseases of the female genital tract: Secondary | ICD-10-CM

## 2023-09-04 NOTE — Progress Notes (Signed)
 Obstetrics and Gynecology New Patient Evaluation  Appointment Date: 09/04/2023  OBGYN Clinic: [facility]  Primary Care Provider: Willo Mini  Referring Provider: Willo Mini, NP  Chief Complaint: folow up April 2025 visit for mid cycle spotting, bleeding and cramping  History of Present Illness: Kirsten Cooper is a 43 y.o.  [phone] , seen for the above chief complaint. Her past medical history is significant for c/s x 2, h/o IVF  Patient seen by pcp on 3/26 with above CC. Work up done and normal CBC, TSH and with prior normal pap in 2024. U/s also ordered and normal with a small IM 1.5cm fibroid noted (see below).  Patient states cycles are still qmonth, regular <1wk and not particularly heavy or painful and she has the mid cycle bleeding (?after exercise) with last episode about two weeks ago  Pt also notes bloating ?since around this time; weight is down (intentionally) about 5-10 lbs over the past year or so. Swab done and plan for f/u in a few months to see about s/s and potential embx at that time  Interval History: swab came back +for yeast and UTI on ucx with medications sent in for both.  Patient seen in ED for abdominal pain that she notes is distinct from feeling as part of her GYN organs and is getting a work up with GI. She continues to have regular monthly periods that aren't heavy or painful and she no longer has any mid cycle spotting.   Review of Systems: Pertinent items are noted in HPI.   Patient Active Problem List   Diagnosis Date Noted   Abdominal bloating 07/03/2023   Right knee buckling 06/07/2023   Angular cheilitis 06/07/2023   Idiopathic intracranial hypertension 03/01/2023   Right tennis elbow 03/01/2023   Acne vulgaris 03/01/2023   Acute low back pain with bilateral sciatica 04/06/2022   Left anterior knee pain 04/06/2022   ADHD (attention deficit hyperactivity disorder) 07/16/2019   Exertional headache 04/18/2018    Past  Medical History:  Past Medical History:  Diagnosis Date   Exertional headache 04/18/2018   GERD with esophagitis 09/09/2018    Past Surgical History:  Past Surgical History:  Procedure Laterality Date   CESAREAN SECTION     DILATION AND EVACUATION N/A 06/25/2021   Procedure: DILATATION AND EVACUATION;  Surgeon: Eveline Lynwood MATSU, MD;  Location: [facility] OR;  Service: Gynecology;  Laterality: N/A;    Past Obstetrical History:  OB History  Gravida Para Term Preterm AB Living  5 2 2  3 2   SAB IAB Ectopic Multiple Live Births  1 1 1  2     # Outcome Date GA Lbr Len/2nd Weight Sex Type Anes PTL Lv  5 SAB 06/25/21     SAB     4 Term 05/19/00     CS-LTranv     3 Term 06/29/95     CS-LTranv     2 Ectopic           1 IAB             Past Gynecological History: As per HPI. History of Pap Smear(s): Yes.   Last pap 2024, which was negative and HPV negative.  She is currently using no method for contraception.   Social History:  Social History   Socioeconomic History   Marital status: Married    Spouse name: Not on file   Number of children: 2   Years of education: 16   Highest education  level: Bachelor's degree (e.g., BA, AB, BS)  Occupational History   Occupation: Advice worker: [employer]   Occupation: Social Work  Tobacco Use   Smoking status: Never    Passive exposure: Past   Smokeless tobacco: Never  Vaping Use   Vaping status: Never Used  Substance and Sexual Activity   Alcohol use: Not Currently   Drug use: Never   Sexual activity: Yes    Partners: Male    Birth control/protection: None  Other Topics Concern   Not on file  Social History Narrative   Lives with Ozell, husband, and daughter [place].  No pets.   Social Drivers of Corporate investment banker Strain: Low Risk  (03/24/2023)   Received from [facility]   Overall Financial Resource Strain (CARDIA)    Difficulty of Paying Living Expenses: Not hard at all  Food Insecurity: No Food Insecurity  (03/24/2023)   Received from [facility]   Hunger Vital Sign    Within the past 12 months, you worried that your food would run out before you got the money to buy more.: Never true    Within the past 12 months, the food you bought just didn't last and you didn't have money to get more.: Never true  Transportation Needs: No Transportation Needs (03/24/2023)   Received from [facility]    Lack of Transportation (Medical): No    Lack of Transportation (Non-Medical): No  Physical Activity: Not on file  Stress: Not on file  Social Connections: Not on file  Intimate Partner Violence: Not on file    Family History:  Family History  Problem Relation Age of Onset   COPD Mother    Breast cancer Paternal Grandmother    Alzheimer's disease Paternal Grandmother     Health Maintenance:  Mammogram(s): Yes.   Date: 12/2022, birads 1 Colonoscopy: No.  Medications Denys Steckman had no medications administered during this visit. Current Outpatient Medications  Medication Sig Dispense Refill   acetaZOLAMIDE  (DIAMOX ) 125 MG tablet Take 125 mg by mouth 2 (two) times daily.     nystatin -triamcinolone  ointment (MYCOLOG) Apply 1 Application topically 2 (two) times daily. (Patient not taking: Reported on 09/04/2023) 30 g 0   ondansetron  (ZOFRAN -ODT) 4 MG disintegrating tablet Take 1 tablet (4 mg total) by mouth every 8 (eight) hours as needed for nausea or vomiting. (Patient not taking: Reported on 09/04/2023) 20 tablet 0   pantoprazole  (PROTONIX ) 20 MG tablet Take 1 tablet (20 mg total) by mouth daily. (Patient not taking: Reported on 09/04/2023) 30 tablet 1   sucralfate  (CARAFATE ) 1 g tablet Take 1 tablet (1 g total) by mouth 4 (four) times daily -  with meals and at bedtime for 15 days. (Patient not taking: Reported on 09/04/2023) 60 tablet 0   No current facility-administered medications for this visit.   Allergies Patient has no known allergies.  Physical Exam:  BP  109/75   Pulse 81   Wt 182 lb (82.6 kg)   LMP 07/16/2023   BMI 33.29 kg/m  Body mass index is 33.29 kg/m. General appearance: Well nourished, well developed female in no acute distress.  Neuro/Psych:  Normal mood and affect.   Laboratory: none  Radiology: n/a  Assessment: patient stable  Plan:  1. History of mid cycle bleeding Pt to keep an eye on s/s and let us  know if they re-occur for consideration for an embx.  2. Abdominal bloating Seeing GI  Return if symptoms worsen or fail to improve.  Future Appointments  Date Time Provider Department Center  09/18/2023  9:30 AM Arletta Camie BRAVO, PA-C [facility]    Bebe Izell Raddle MD Attending [facility])

## 2023-09-04 NOTE — Progress Notes (Signed)
 Follow-Up  Pt no longer having any bleeding or pain. Had ER visit 08/14/23 for abdominal pain. Pt notes now seeing gastro

## 2023-09-18 ENCOUNTER — Ambulatory Visit: Admitting: Gastroenterology

## 2023-09-18 NOTE — Progress Notes (Deleted)
 Kirsten Cooper [id number] June 08, 1980   Chief Complaint: Abdominal pain  Referring Provider: Willo Mini, NP Primary GI MD: Sampson  HPI: Kirsten Cooper is a 43 y.o. female with past medical history of GERD, idiopathic intracranial hypertension who presents today for a complaint of abdominal pain .    08/14/2023 patient seen in the ED for complaint of 2 weeks of intermittent abdominal pain and bloating with recent worsening of epigastric and central abdominal pain and associated nausea.  Pain and nausea are worse directly after eating.  No improvement with Tums.  Did have history of antibiotic use 3 weeks prior.  CT scan showed no acute findings, PUD suspected and patient referred for outpatient follow-up with GI.  Labs 08/14/2023: WBC 11.2 otherwise normal CBC, normal liver and kidney function, mildly elevated lipase 74  She has had a follow-up abdominal ultrasound, also with no source of abdominal pain identified.   Previous GI Procedures/Imaging   Abdominal ultrasound 08/22/2023 No sonographic etiology for abdominal pain is identified.   CT A/P 08/14/2023 No acute localizing process in the abdomen or pelvis.    Past Medical History:  Diagnosis Date   Exertional headache 04/18/2018   GERD with esophagitis 09/09/2018    Past Surgical History:  Procedure Laterality Date   CESAREAN SECTION     DILATION AND EVACUATION N/A 06/25/2021   Procedure: DILATATION AND EVACUATION;  Surgeon: Eveline Lynwood MATSU, MD;  Location: [facility] OR;  Service: Gynecology;  Laterality: N/A;    Current Outpatient Medications  Medication Sig Dispense Refill   acetaZOLAMIDE  (DIAMOX ) 125 MG tablet Take 125 mg by mouth 2 (two) times daily.     nystatin -triamcinolone  ointment (MYCOLOG) Apply 1 Application topically 2 (two) times daily. (Patient not taking: Reported on 09/04/2023) 30 g 0   ondansetron  (ZOFRAN -ODT) 4 MG disintegrating tablet Take 1 tablet (4 mg total) by mouth every 8 (eight) hours as needed for nausea  or vomiting. (Patient not taking: Reported on 09/04/2023) 20 tablet 0   pantoprazole  (PROTONIX ) 20 MG tablet Take 1 tablet (20 mg total) by mouth daily. (Patient not taking: Reported on 09/04/2023) 30 tablet 1   sucralfate  (CARAFATE ) 1 g tablet Take 1 tablet (1 g total) by mouth 4 (four) times daily -  with meals and at bedtime for 15 days. (Patient not taking: Reported on 09/04/2023) 60 tablet 0   No current facility-administered medications for this visit.    Allergies as of 09/18/2023   (No Known Allergies)    Family History  Problem Relation Age of Onset   COPD Mother    Breast cancer Paternal Grandmother    Alzheimer's disease Paternal Grandmother     Social History   Tobacco Use   Smoking status: Never    Passive exposure: Past   Smokeless tobacco: Never  Vaping Use   Vaping status: Never Used  Substance Use Topics   Alcohol use: Not Currently   Drug use: Never     Review of Systems:    Constitutional: No weight loss, fever, chills, weakness or fatigue Eyes: No change in vision Ears, Nose, Throat:  No change in hearing or congestion Skin: No rash or itching Cardiovascular: No chest pain, chest pressure or palpitations   Respiratory: No SOB or cough Gastrointestinal: See HPI and otherwise negative Genitourinary: No dysuria or change in urinary frequency Neurological: No headache, dizziness or syncope Musculoskeletal: No new muscle or joint pain Hematologic: No bleeding or bruising    Physical Exam:  Vital signs: There were no vitals  taken for this visit.  Constitutional: NAD, Well developed, Well nourished, alert and cooperative Head:  Normocephalic and atraumatic.  Eyes: No scleral icterus. Conjunctiva pink. Mouth: No oral lesions. Respiratory: Respirations even and unlabored. Lungs clear to auscultation bilaterally.  No wheezes, crackles, or rhonchi.  Cardiovascular:  Regular rate and rhythm. No murmurs. No peripheral edema. Gastrointestinal:  Soft,  nondistended, nontender. No rebound or guarding. Normal bowel sounds. No appreciable masses or hepatomegaly. Rectal:  Not performed.  Neurologic:  Alert and oriented x4;  grossly normal neurologically.  Skin:   Dry and intact without significant lesions or rashes. Psychiatric: Oriented to person, place and time. Demonstrates good judgement and reason without abnormal affect or behaviors.   RELEVANT LABS AND IMAGING: CBC    Component Value Date/Time   WBC 11.2 (H) 08/14/2023 1415   RBC 5.03 08/14/2023 1415   HGB 14.5 08/14/2023 1415   HGB 14.9 06/07/2023 1409   HCT 44.5 08/14/2023 1415   HCT 45.6 06/07/2023 1409   PLT 305 08/14/2023 1415   PLT 293 06/07/2023 1409   MCV 88.5 08/14/2023 1415   MCV 89 06/07/2023 1409   MCH 28.8 08/14/2023 1415   MCHC 32.6 08/14/2023 1415   RDW 13.1 08/14/2023 1415   RDW 13.1 06/07/2023 1409   LYMPHSABS 2.9 06/07/2023 1409   MONOABS 0.6 11/23/2022 1516   EOSABS 0.1 06/07/2023 1409   BASOSABS 0.1 06/07/2023 1409    CMP     Component Value Date/Time   NA 137 08/14/2023 1400   NA 136 06/07/2023 1409   K 4.4 08/14/2023 1400   CL 109 08/14/2023 1400   CO2 16 (L) 08/14/2023 1400   GLUCOSE 70 08/14/2023 1400   BUN 12 08/14/2023 1400   BUN 11 06/07/2023 1409   CREATININE 0.89 08/14/2023 1400   CREATININE 0.99 04/02/2021 0000   CALCIUM 8.9 08/14/2023 1400   PROT 6.9 08/14/2023 1400   PROT 8.0 06/07/2023 1409   ALBUMIN 3.7 08/14/2023 1400   ALBUMIN 4.6 06/07/2023 1409   AST 20 08/14/2023 1400   ALT 6 08/14/2023 1400   ALKPHOS 125 08/14/2023 1400   BILITOT 0.4 08/14/2023 1400   BILITOT 0.5 06/07/2023 1409   GFRNONAA >60 08/14/2023 1400   GFRNONAA 84 10/16/2019 0838   GFRAA 97 10/16/2019 0838     Assessment/Plan:   Due for colon cancer screening Would schedule colonoscopy with EGD if continued epigastric pain    Camie Furbish, PA-C [facility] 09/18/2023, 8:10 AM  Patient Care Team: Willo Mini, NP as PCP - General  (Nurse Practitioner) Georjean Darice HERO, MD as Consulting Physician (Neurology)

## 2023-09-20 DIAGNOSIS — K2980 Duodenitis without bleeding: Secondary | ICD-10-CM | POA: Diagnosis not present

## 2023-09-20 DIAGNOSIS — K2930 Chronic superficial gastritis without bleeding: Secondary | ICD-10-CM | POA: Diagnosis not present

## 2023-09-20 DIAGNOSIS — R140 Abdominal distension (gaseous): Secondary | ICD-10-CM | POA: Diagnosis not present

## 2023-09-20 DIAGNOSIS — D122 Benign neoplasm of ascending colon: Secondary | ICD-10-CM | POA: Diagnosis not present

## 2023-09-20 DIAGNOSIS — Z1211 Encounter for screening for malignant neoplasm of colon: Secondary | ICD-10-CM | POA: Diagnosis not present

## 2023-09-20 DIAGNOSIS — R1010 Upper abdominal pain, unspecified: Secondary | ICD-10-CM | POA: Diagnosis not present

## 2023-09-20 DIAGNOSIS — K3189 Other diseases of stomach and duodenum: Secondary | ICD-10-CM | POA: Diagnosis not present

## 2023-09-20 DIAGNOSIS — K648 Other hemorrhoids: Secondary | ICD-10-CM | POA: Diagnosis not present

## 2023-09-20 LAB — HM COLONOSCOPY

## 2023-10-02 DIAGNOSIS — R4189 Other symptoms and signs involving cognitive functions and awareness: Secondary | ICD-10-CM | POA: Diagnosis not present

## 2023-10-02 DIAGNOSIS — Z79899 Other long term (current) drug therapy: Secondary | ICD-10-CM | POA: Diagnosis not present

## 2023-10-02 DIAGNOSIS — G44209 Tension-type headache, unspecified, not intractable: Secondary | ICD-10-CM | POA: Diagnosis not present

## 2023-10-02 DIAGNOSIS — G43009 Migraine without aura, not intractable, without status migrainosus: Secondary | ICD-10-CM | POA: Diagnosis not present

## 2023-10-02 DIAGNOSIS — R519 Headache, unspecified: Secondary | ICD-10-CM | POA: Diagnosis not present

## 2023-10-02 DIAGNOSIS — R531 Weakness: Secondary | ICD-10-CM | POA: Diagnosis not present

## 2023-10-02 DIAGNOSIS — F32A Depression, unspecified: Secondary | ICD-10-CM | POA: Diagnosis not present

## 2023-10-02 DIAGNOSIS — F419 Anxiety disorder, unspecified: Secondary | ICD-10-CM | POA: Diagnosis not present

## 2023-10-02 DIAGNOSIS — R42 Dizziness and giddiness: Secondary | ICD-10-CM | POA: Diagnosis not present

## 2023-10-25 ENCOUNTER — Encounter: Payer: Self-pay | Admitting: Medical-Surgical

## 2023-10-25 DIAGNOSIS — E349 Endocrine disorder, unspecified: Secondary | ICD-10-CM

## 2023-10-25 DIAGNOSIS — G932 Benign intracranial hypertension: Secondary | ICD-10-CM

## 2023-11-14 ENCOUNTER — Emergency Department
Admission: EM | Admit: 2023-11-14 | Discharge: 2023-11-14 | Disposition: A | Discharge status: Home / Self Care | Attending: Emergency Medicine | Admitting: Emergency Medicine

## 2023-11-14 ENCOUNTER — Ambulatory Visit: Admission: EM | Admit: 2023-11-14 | Discharge: 2023-11-14 | Disposition: A | Discharge status: Home / Self Care

## 2023-11-14 ENCOUNTER — Emergency Department

## 2023-11-14 ENCOUNTER — Encounter: Payer: Self-pay | Admitting: Sports Medicine

## 2023-11-14 ENCOUNTER — Other Ambulatory Visit: Payer: Self-pay

## 2023-11-14 ENCOUNTER — Other Ambulatory Visit: Payer: Self-pay | Admitting: Medical-Surgical

## 2023-11-14 DIAGNOSIS — R0602 Shortness of breath: Secondary | ICD-10-CM | POA: Diagnosis not present

## 2023-11-14 DIAGNOSIS — T7840XA Allergy, unspecified, initial encounter: Secondary | ICD-10-CM | POA: Insufficient documentation

## 2023-11-14 DIAGNOSIS — R0600 Dyspnea, unspecified: Secondary | ICD-10-CM | POA: Diagnosis not present

## 2023-11-14 DIAGNOSIS — Z1231 Encounter for screening mammogram for malignant neoplasm of breast: Secondary | ICD-10-CM

## 2023-11-14 DIAGNOSIS — R079 Chest pain, unspecified: Secondary | ICD-10-CM | POA: Diagnosis not present

## 2023-11-14 DIAGNOSIS — R0789 Other chest pain: Secondary | ICD-10-CM | POA: Diagnosis not present

## 2023-11-14 LAB — BASIC METABOLIC PANEL WITH GFR
Anion gap: 11 (ref 5–15)
BUN: 15 mg/dL (ref 6–20)
CO2: 17 mmol/L — ABNORMAL LOW (ref 22–32)
Calcium: 8.7 mg/dL — ABNORMAL LOW (ref 8.9–10.3)
Chloride: 107 mmol/L (ref 98–111)
Creatinine, Ser: 0.99 mg/dL (ref 0.44–1.00)
GFR, Estimated: >60 mL/min (ref >60)
Glucose, Bld: 77 mg/dL (ref 70–99)
Potassium: 3.7 mmol/L (ref 3.5–5.1)
Sodium: 135 mmol/L (ref 135–145)

## 2023-11-14 LAB — CBC
HCT: 45.3 % (ref 36.0–46.0)
Hemoglobin: 14.5 g/dL (ref 12.0–15.0)
MCH: 28.6 pg (ref 26.0–34.0)
MCHC: 32 g/dL (ref 30.0–36.0)
MCV: 89.3 fL (ref 80.0–100.0)
Platelets: 345 K/uL (ref 150–400)
RBC: 5.07 MIL/uL (ref 3.87–5.11)
RDW: 13 % (ref 11.5–15.5)
WBC: 13.3 K/uL — ABNORMAL HIGH (ref 4.0–10.5)
nRBC: 0 % (ref 0.0–0.2)

## 2023-11-14 LAB — D-DIMER, QUANTITATIVE: D-Dimer, Quant: 0.37 ug{FEU}/mL (ref 0.00–0.50)

## 2023-11-14 LAB — TROPONIN I (HIGH SENSITIVITY)
Troponin I (High Sensitivity): 5 ng/L (ref <18)
Troponin I (High Sensitivity): 4 ng/L (ref <18)

## 2023-11-14 LAB — HCG, QUANTITATIVE, PREGNANCY: hCG, Beta Chain, Quant, S: 1 m[IU]/mL (ref <5)

## 2023-11-14 MED ORDER — EPINEPHRINE 0.3 MG/0.3ML IJ SOAJ
0.3000 mg | INTRAMUSCULAR | 1 refills | Status: AC | PRN
Start: 2023-11-14 — End: ?

## 2023-11-14 NOTE — ED Provider Notes (Signed)
 [facility] Provider Note    Event Date/Time   First MD Initiated Contact with Patient 11/14/23 2130     (approximate)   History   Chest Pain   HPI  Kirsten Cooper is a 43 y.o. female with history of GERD with esophagitis, ADHD, presenting with chest pain.  Patient states that she has had oral allergies to apples in the past, ate an apple and started having some chest pain shortness of breath.  Took 2 Benadryl 's and used her inhaler with improvement to her symptoms.  Now asymptomatic.  She denies any infectious symptoms.  Has had sensation of throat closing after eating cherries.  Does not use an EpiPen .  Has not seen an allergist.  No prior cardiac issues, no history of CAD or CHF.  No recent travel or surgeries, no history of blood clots.  No history of malignancies.    On independent review, she was seen by neurology for IIH.  Has history of headaches with migraine features.  Also with episodic leg weakness and dizziness.  She does not any have any headaches at this time.  Physical Exam   Triage Vital Signs: ED Triage Vitals  Encounter Vitals Group     BP 11/14/23 1929 (!) 125/98     Girls Systolic BP Percentile --      Girls Diastolic BP Percentile --      Boys Systolic BP Percentile --      Boys Diastolic BP Percentile --      Pulse Rate 11/14/23 1929 79     Resp 11/14/23 1929 18     Temp 11/14/23 1929 97.9 F (36.6 C)     Temp Source 11/14/23 1929 Oral     SpO2 11/14/23 1929 100 %     Weight 11/14/23 1928 180 lb (81.6 kg)     Height 11/14/23 1928 5' 2 (1.575 m)     Head Circumference --      Peak Flow --      Pain Score 11/14/23 1928 7     Pain Loc --      Pain Education --      Exclude from Growth Chart --     Most recent vital signs: Vitals:   11/14/23 1929 11/14/23 2348  BP: (!) 125/98 126/84  Pulse: 79 72  Resp: 18 16  Temp: 97.9 F (36.6 C) 97.8 F (36.6 C)  SpO2: 100% 100%     General: Awake, no distress.  CV:  Good  peripheral perfusion.  Resp:  Normal effort.  No tachypnea or respiratory distress Abd:  No distention.  Soft nontender Other:  Clear oropharynx without any swelling or erythema, no unilateral Swelling or tenderness   ED Results / Procedures / Treatments   Labs (all labs ordered are listed, but only abnormal results are displayed) Labs Reviewed  BASIC METABOLIC PANEL WITH GFR - Abnormal; Notable for the following components:      Result Value   CO2 17 (*)    Calcium 8.7 (*)    All other components within normal limits  CBC - Abnormal; Notable for the following components:   WBC 13.3 (*)    All other components within normal limits  D-DIMER, QUANTITATIVE  HCG, QUANTITATIVE, PREGNANCY  TROPONIN I (HIGH SENSITIVITY)  TROPONIN I (HIGH SENSITIVITY)     EKG  EKG shows, sinus rhythm, rate 72, normal QS, normal QTc, no obvious ischemic ST elevation, not significantly changed compared to prior   RADIOLOGY On  my independent interpretation, chest x-ray without obvious consolidation.   PROCEDURES:  Critical Care performed: No  Procedures   MEDICATIONS ORDERED IN ED: Medications - No data to display   IMPRESSION / MDM / ASSESSMENT AND PLAN / ED COURSE  I reviewed the triage vital signs and the nursing notes.                              Differential diagnosis includes, but is not limited to, allergic reaction, no evidence of anaphylaxis at this time, anxiety, angina, ACS, no infectious symptoms suggest pneumonia viral illness, did consider PE but she has no other risk factors for it.  Labs, EKG, troponin, chest x-ray, D-dimer.  Patient's presentation is most consistent with acute presentation with potential threat to life or bodily function.  Independent interpretation of labs and imaging below.  Discussed with patient about avoiding apples in the future and to follow-up with primary care doctor, she may need a referral to allergist.  Will give her a prescription for EpiPen   that she can use if she has any recurrence of the shortness of breath, throat closing, facial swelling.  Considered but no indication for inpatient admission at this time, she safe for outpatient management.  Will discharge with strict return precautions.  Shared decision making done with patient and she is agreeable with this plan.  The patient is on the cardiac monitor to evaluate for evidence of arrhythmia and/or significant heart rate changes.   Clinical Course as of 11/16/23 1103  Tue Nov 14, 2023  2214 Independent review of labs, mild leukocytosis, electrolytes are severely deranged, D-dimer is not elevated, troponin is not elevated. [TT]  2219 DG Chest 2 View No acute cardiopulmonary abnormality.  [TT]  2242 HCG, Beta Chain, Quant, S: 1 [TT]    Clinical Course User Index [TT] Waymond Lorelle Cummins, MD     FINAL CLINICAL IMPRESSION(S) / ED DIAGNOSES   Final diagnoses:  Chest pain, unspecified type  SOB (shortness of breath)  Allergic reaction, initial encounter     Rx / DC Orders   ED Discharge Orders          Ordered    EPINEPHrine  0.3 mg/0.3 mL IJ SOAJ injection  As needed        11/14/23 2227             Note:  This document was prepared using Dragon voice recognition software and may include unintentional dictation errors.    Waymond Lorelle Cummins, MD 11/16/23 [phone]

## 2023-11-14 NOTE — ED Triage Notes (Signed)
 Patient to Urgent Care with complaints of shortness of breath and chest tightness/ lip tingling after eating an apple 20 minutes ago.  Talking in full sentences. NAD at this time. Denies any prior known allergy.   Took 2 benadryl  PTA.   (Chose to go to ER prior to being pulled back to triage room).

## 2023-11-14 NOTE — ED Triage Notes (Signed)
 Pt reports chest pain and shortness or breath that began today after eating an apple, pt has had similar allergic reaction to apples in past. Pt reports she took 2 benadryl  and an inhaler PTA and her symptoms have improved. Pt repots chest heaviness that is worse with inspiration and sob still at this time.

## 2023-11-14 NOTE — Discharge Instructions (Signed)
 Please avoid eating apples until you see your primary care doctor and are evaluated by an allergist.  If you have any shortness of breath after an ingestion or exposure, if you have any throat tightness, facial swelling, please give yourself the EpiPen  and call 911.

## 2023-11-21 DIAGNOSIS — R42 Dizziness and giddiness: Secondary | ICD-10-CM | POA: Diagnosis not present

## 2023-11-21 DIAGNOSIS — H938X3 Other specified disorders of ear, bilateral: Secondary | ICD-10-CM | POA: Diagnosis not present

## 2023-11-22 ENCOUNTER — Encounter: Payer: Self-pay | Admitting: Medical-Surgical

## 2023-12-13 DIAGNOSIS — G932 Benign intracranial hypertension: Secondary | ICD-10-CM | POA: Diagnosis not present

## 2023-12-13 DIAGNOSIS — R42 Dizziness and giddiness: Secondary | ICD-10-CM | POA: Diagnosis not present

## 2023-12-13 DIAGNOSIS — R4189 Other symptoms and signs involving cognitive functions and awareness: Secondary | ICD-10-CM | POA: Diagnosis not present

## 2023-12-19 DIAGNOSIS — R42 Dizziness and giddiness: Secondary | ICD-10-CM | POA: Diagnosis not present

## 2023-12-28 ENCOUNTER — Ambulatory Visit
Admission: RE | Admit: 2023-12-28 | Discharge: 2023-12-28 | Disposition: A | Discharge status: Home / Self Care | Source: Ambulatory Visit | Attending: Medical-Surgical | Admitting: Medical-Surgical

## 2023-12-28 DIAGNOSIS — Z1231 Encounter for screening mammogram for malignant neoplasm of breast: Secondary | ICD-10-CM

## 2024-01-01 ENCOUNTER — Ambulatory Visit: Payer: Self-pay | Admitting: Medical-Surgical

## 2024-01-02 DIAGNOSIS — R42 Dizziness and giddiness: Secondary | ICD-10-CM | POA: Diagnosis not present
